# Patient Record
Sex: Male | Born: 1955 | Race: White | Hispanic: No | Marital: Married | Smoking: Current every day smoker
Health system: Southern US, Community
[De-identification: ages and names within clinical notes are randomized; demographics above are authoritative.]

## PROBLEM LIST (undated history)

## (undated) DIAGNOSIS — C61 Malignant neoplasm of prostate: Secondary | ICD-10-CM

## (undated) DIAGNOSIS — K219 Gastro-esophageal reflux disease without esophagitis: Secondary | ICD-10-CM

## (undated) DIAGNOSIS — M1990 Unspecified osteoarthritis, unspecified site: Secondary | ICD-10-CM

## (undated) DIAGNOSIS — J439 Emphysema, unspecified: Secondary | ICD-10-CM

## (undated) DIAGNOSIS — E7800 Pure hypercholesterolemia, unspecified: Secondary | ICD-10-CM

## (undated) DIAGNOSIS — Z860100 Personal history of colon polyps, unspecified: Secondary | ICD-10-CM

## (undated) DIAGNOSIS — F419 Anxiety disorder, unspecified: Secondary | ICD-10-CM

## (undated) DIAGNOSIS — J449 Chronic obstructive pulmonary disease, unspecified: Secondary | ICD-10-CM

## (undated) DIAGNOSIS — R0789 Other chest pain: Secondary | ICD-10-CM

## (undated) DIAGNOSIS — T7840XA Allergy, unspecified, initial encounter: Secondary | ICD-10-CM

## (undated) HISTORY — DX: Pure hypercholesterolemia, unspecified: E78.00

## (undated) HISTORY — DX: Other chest pain: R07.89

## (undated) HISTORY — DX: Personal history of colonic polyps: Z86.010

## (undated) HISTORY — DX: Unspecified osteoarthritis, unspecified site: M19.90

## (undated) HISTORY — DX: Chronic obstructive pulmonary disease, unspecified: J44.9

## (undated) HISTORY — DX: Emphysema, unspecified: J43.9

## (undated) HISTORY — DX: Anxiety disorder, unspecified: F41.9

## (undated) HISTORY — DX: Malignant neoplasm of prostate: C61

## (undated) HISTORY — DX: Gastro-esophageal reflux disease without esophagitis: K21.9

## (undated) HISTORY — DX: Allergy, unspecified, initial encounter: T78.40XA

## (undated) HISTORY — DX: Personal history of colon polyps, unspecified: Z86.0100

## (undated) HISTORY — PX: OTHER SURGICAL HISTORY: SHX169

---

## 2000-02-01 ENCOUNTER — Other Ambulatory Visit: Admission: RE | Admit: 2000-02-01 | Discharge: 2000-02-01 | Payer: Self-pay | Admitting: Gastroenterology

## 2000-02-01 ENCOUNTER — Encounter (INDEPENDENT_AMBULATORY_CARE_PROVIDER_SITE_OTHER): Payer: Self-pay | Admitting: Specialist

## 2002-07-29 HISTORY — PX: CHOLECYSTECTOMY: SHX55

## 2002-11-03 ENCOUNTER — Ambulatory Visit (HOSPITAL_COMMUNITY): Admission: RE | Admit: 2002-11-03 | Discharge: 2002-11-03 | Payer: Self-pay | Admitting: Pulmonary Disease

## 2002-11-03 ENCOUNTER — Encounter: Payer: Self-pay | Admitting: Pulmonary Disease

## 2005-01-25 ENCOUNTER — Ambulatory Visit: Payer: Self-pay | Admitting: Pulmonary Disease

## 2006-07-04 ENCOUNTER — Ambulatory Visit: Payer: Self-pay | Admitting: Pulmonary Disease

## 2006-09-03 ENCOUNTER — Ambulatory Visit: Payer: Self-pay | Admitting: Cardiovascular Disease

## 2007-06-26 ENCOUNTER — Telehealth: Payer: Self-pay | Admitting: Pulmonary Disease

## 2008-05-02 ENCOUNTER — Telehealth (INDEPENDENT_AMBULATORY_CARE_PROVIDER_SITE_OTHER): Payer: Self-pay | Admitting: *Deleted

## 2008-06-30 DIAGNOSIS — K219 Gastro-esophageal reflux disease without esophagitis: Secondary | ICD-10-CM | POA: Insufficient documentation

## 2008-06-30 DIAGNOSIS — F411 Generalized anxiety disorder: Secondary | ICD-10-CM | POA: Insufficient documentation

## 2008-06-30 DIAGNOSIS — T7840XA Allergy, unspecified, initial encounter: Secondary | ICD-10-CM | POA: Insufficient documentation

## 2008-06-30 DIAGNOSIS — M1990 Unspecified osteoarthritis, unspecified site: Secondary | ICD-10-CM | POA: Insufficient documentation

## 2008-06-30 DIAGNOSIS — D126 Benign neoplasm of colon, unspecified: Secondary | ICD-10-CM | POA: Insufficient documentation

## 2008-07-01 ENCOUNTER — Encounter: Payer: Self-pay | Admitting: Gastroenterology

## 2008-07-01 ENCOUNTER — Ambulatory Visit: Payer: Self-pay | Admitting: Pulmonary Disease

## 2008-07-01 DIAGNOSIS — R0789 Other chest pain: Secondary | ICD-10-CM | POA: Insufficient documentation

## 2008-07-01 DIAGNOSIS — E7800 Pure hypercholesterolemia, unspecified: Secondary | ICD-10-CM | POA: Insufficient documentation

## 2008-07-01 DIAGNOSIS — J439 Emphysema, unspecified: Secondary | ICD-10-CM | POA: Insufficient documentation

## 2008-07-03 LAB — CONVERTED CEMR LAB
Albumin: 4.2 g/dL (ref 3.5–5.2)
Alkaline Phosphatase: 73 units/L (ref 39–117)
BUN: 14 mg/dL (ref 6–23)
Cholesterol: 172 mg/dL (ref 0–200)
Creatinine, Ser: 0.9 mg/dL (ref 0.4–1.5)
Eosinophils Absolute: 0.2 10*3/uL (ref 0.0–0.7)
Eosinophils Relative: 2 % (ref 0.0–5.0)
GFR calc Af Amer: 114 mL/min
GFR calc non Af Amer: 95 mL/min
HCT: 45 % (ref 39.0–52.0)
HDL: 35.5 mg/dL — ABNORMAL LOW (ref >39.0)
MCV: 92.4 fL (ref 78.0–100.0)
Monocytes Absolute: 0.6 10*3/uL (ref 0.1–1.0)
PSA: 5.98 ng/mL — ABNORMAL HIGH (ref 0.10–4.00)
Platelets: 276 10*3/uL (ref 150–400)
Potassium: 4.6 meq/L (ref 3.5–5.1)
TSH: 1.04 microintl units/mL (ref 0.35–5.50)
Triglycerides: 81 mg/dL (ref 0–149)
WBC: 8.8 10*3/uL (ref 4.5–10.5)

## 2008-07-08 ENCOUNTER — Telehealth: Payer: Self-pay | Admitting: Pulmonary Disease

## 2008-08-12 ENCOUNTER — Ambulatory Visit: Payer: Self-pay | Admitting: Gastroenterology

## 2008-08-12 ENCOUNTER — Encounter: Payer: Self-pay | Admitting: Gastroenterology

## 2008-08-12 ENCOUNTER — Ambulatory Visit: Payer: Self-pay | Admitting: Pulmonary Disease

## 2008-08-14 LAB — CONVERTED CEMR LAB: PSA: 5.67 ng/mL — ABNORMAL HIGH (ref 0.10–4.00)

## 2008-08-16 ENCOUNTER — Encounter: Payer: Self-pay | Admitting: Gastroenterology

## 2008-08-18 ENCOUNTER — Encounter: Payer: Self-pay | Admitting: Pulmonary Disease

## 2008-09-02 ENCOUNTER — Encounter: Payer: Self-pay | Admitting: Pulmonary Disease

## 2008-09-09 ENCOUNTER — Encounter: Payer: Self-pay | Admitting: Pulmonary Disease

## 2008-09-15 ENCOUNTER — Encounter: Payer: Self-pay | Admitting: Pulmonary Disease

## 2008-09-27 ENCOUNTER — Encounter: Payer: Self-pay | Admitting: Pulmonary Disease

## 2008-10-31 ENCOUNTER — Telehealth: Payer: Self-pay | Admitting: Pulmonary Disease

## 2008-11-26 HISTORY — PX: PROSTATECTOMY: SHX69

## 2008-12-15 ENCOUNTER — Encounter: Payer: Self-pay | Admitting: Pulmonary Disease

## 2008-12-22 ENCOUNTER — Encounter: Payer: Self-pay | Admitting: Pulmonary Disease

## 2009-02-08 ENCOUNTER — Encounter: Payer: Self-pay | Admitting: Pulmonary Disease

## 2009-05-01 ENCOUNTER — Encounter: Payer: Self-pay | Admitting: Pulmonary Disease

## 2009-06-15 ENCOUNTER — Encounter: Payer: Self-pay | Admitting: Pulmonary Disease

## 2009-12-13 ENCOUNTER — Telehealth (INDEPENDENT_AMBULATORY_CARE_PROVIDER_SITE_OTHER): Payer: Self-pay | Admitting: *Deleted

## 2009-12-22 ENCOUNTER — Ambulatory Visit: Payer: Self-pay | Admitting: Pulmonary Disease

## 2009-12-22 DIAGNOSIS — C61 Malignant neoplasm of prostate: Secondary | ICD-10-CM | POA: Insufficient documentation

## 2009-12-22 LAB — CONVERTED CEMR LAB
Alkaline Phosphatase: 87 units/L (ref 39–117)
Basophils Absolute: 0 10*3/uL (ref 0.0–0.1)
Bilirubin, Direct: 0.1 mg/dL (ref 0.0–0.3)
Calcium: 9.4 mg/dL (ref 8.4–10.5)
Eosinophils Absolute: 0.1 10*3/uL (ref 0.0–0.7)
GFR calc non Af Amer: 112.37 mL/min (ref >60)
HCT: 44.1 % (ref 39.0–52.0)
Hemoglobin: 15.3 g/dL (ref 13.0–17.0)
Lymphs Abs: 2.2 10*3/uL (ref 0.7–4.0)
MCHC: 34.8 g/dL (ref 30.0–36.0)
Neutro Abs: 6.3 10*3/uL (ref 1.4–7.7)
RDW: 12.5 % (ref 11.5–14.6)
Sodium: 144 meq/L (ref 135–145)
TSH: 0.9 microintl units/mL (ref 0.35–5.50)
Total Bilirubin: 0.5 mg/dL (ref 0.3–1.2)
Total CHOL/HDL Ratio: 5
VLDL: 18.8 mg/dL (ref 0.0–40.0)

## 2010-07-19 ENCOUNTER — Encounter: Payer: Self-pay | Admitting: Pulmonary Disease

## 2010-08-28 NOTE — Progress Notes (Signed)
Summary: rx  Phone Note Call from Patient Call back at Work Phone [phone]   Caller: Patient Call For: nadel Reason for Call: Refill Medication, Talk to Nurse Summary of Call: Advair, Spiriva and Nexium need to be called in for [facility] supply [facility] - [phone]  Initial call taken by: Eugene Gavia,  Dec 13, 2009 2:24 PM  Follow-up for Phone Call        Rxs were refilled x 1 only because pt has not been seen in over 1 yr.  He has upcoming appt with SN on 12/22/09.  I advised that he keep appt for a years worth of refills.  Pt verbalized understanding. Follow-up by: Vernie Murders,  Dec 13, 2009 3:00 PM    Prescriptions: NEXIUM 40 MG CPDR (ESOMEPRAZOLE MAGNESIUM) Take 1 tablet by mouth once a day  #30 x 0   Entered by:   Vernie Murders   Authorized by:   Michele Mcalpine MD   Signed by:   Vernie Murders on 12/13/2009   Method used:   Historical   RxID:   [id number] SPIRIVA HANDIHALER 18 MCG CAPS (TIOTROPIUM BROMIDE MONOHYDRATE) inhale contents of 1 cap via handihaler daily...  #30 x 0   Entered by:   Vernie Murders   Authorized by:   Michele Mcalpine MD   Signed by:   Vernie Murders on 12/13/2009   Method used:   Historical   RxID:   [id number] ADVAIR DISKUS 250-50 MCG/DOSE MISC (FLUTICASONE-SALMETEROL) inhale one puff  two times a day   rinse mouth after each use  #1 x 0   Entered by:   Vernie Murders   Authorized by:   Michele Mcalpine MD   Signed by:   Vernie Murders on 12/13/2009   Method used:   Historical   RxID:   [id number]

## 2010-08-28 NOTE — Assessment & Plan Note (Signed)
Summary: cpx/jd   CC:  17 month ROV & CPX....  History of Present Illness: 55 y/o WM here for a follow up visit... he has multiple medical problems as noted below...  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   ~  Dec 22, 2009:  when last seen 12/09 his routine PSA was 5.98 & he was referred to [facility] in [facility] he lives for eval> +prostate cancer and s/p robotic prostatectomy 5/10 & doing well so far w/ PSA <0.01 on last check 11/10 w/ f/u due soon (Q63mo)... no new complaints or concerns>  he has bullous emphysema and normal A1AT level & phenotype (MM), unfortunately still smokes an occas pipe & we discussed this again today... he also had screening colonoscopy 1/10 by DrPatterson= divertics, tiny polyp w/o adenomatous change & f/u planned 57yrs... stable on current med regimen> refilled meds + TDAP vaccine today.    Current Problem List:   PHYSICAL EXAMINATION (ICD-V70.0) - he takes ASA 81mg /d along w/ MVI & Protegra...  ALLERGY (ICD-995.3) - uses OTC antihistamines Prn...  COPD (ICD-496) & EMPHYSEMATOUS BLEB (ICD-492.0) - on ADVAIR 250 Bid & SPIRIVA daily... unfortunately he still smokes a pipe on occas (w/ min cough, drainage, etc)... he has a neg FamHx of lung disease and a normal A1AT level (MM phenotype)... he is s/p VATS- RUL bullectomy in 1997 for infected bulla w/ A/F level.  ~  baseline CXR's back to 1990 showed large bilat bullae R>L & scarring at left base...  ~  f/u CXR's w/ vol loss on right from the surg, bullae on the left, scarring, etc...  ~  prev CT Chest revealed mult blebs & bullae in RUL & LUL to the lingular w/ scarring... 1997- 14cm RUL bulla w/ AF level (required VATS Bullectomy- path= emphysema, infected bulla w/ acute/ chr inflamm.  ~  CT Chest 10/09 in [place]= similar bullous dis on left w/ emphysema, post-op changes on right, NAD,etc.  ~  PFT's 6/00 showed FVC= 4.41 (84%), FEV1= 2.53 (59%), %1sec=  57, mid-flows= 25%pred...  ~  PFT's 6/06 showed FVC= 4.59 (87%), FEV1= 2.79 (65%), %1sec= 61, mid-flows= 27%pred...  ~  CXR 5/11 showed advanced chr lung dis w/ hyperinflation, scarring, decr vol on right> NAD.  Hx of CHEST PAIN, ATYPICAL (ICD-786.59) - on ASA 81mg /d... hx intermittent chest wall tightness/ spasm/ ?palpit/ numb in left arm- last 2-3 min and resolves spont, no exercise induced... s/p cardiac eval 2/08 by DrCooper- he wanted to do treadmill and holter but pt never did these and states he is doing fine w/o any incr in symptoms...  ~  EKG 5/11 showed NSR 74/min, IVCD, NAD (no ch from old tracings).Marland Kitchen  HYPERCHOLESTEROLEMIA, MILD (ICD-272.0) - on diet alone...  ~  FLP 12/07 showed TChol 182, TG 80, HDL 36, LDL 130... he prefers diet Rx.  ~  FLP 12/09 showed TChol 172, TG 81, HDL 36, LDL 120... rec- improve diet efforts, or consider statin.  ~  FLP 5/11 showed TChol 169, TG 94, HDL 37, LDL 113... continue diet efforts.  GERD (ICD-530.81) - on NEXIUM 40mg /d... he states he tried off the med but had to restart due to signif heartburn...  COLONIC POLYPS (ICD-211.3) & Family Hx of ADENOCARCINOMA, COLON (ICD-153.9)  - last colonoscopy 7/01 by DrPatterson showed diminutive rectal polyp= hyperplastic... f/u planned 60yrs but pt cancelled and never rescheduled the procedure... f/u colonoscopy 1/10 showed divertics & tiny polypw/o  adenomatous change (+FamHx of colon cancer in brother who died of the disease at age 78)...  ADENOCARCINOMA, PROSTATE (ICD-185) - ** SEE ABOVE **  DEGENERATIVE JOINT DISEASE (ICD-715.90) - hx DJD in knees and prev fractured arm...  ANXIETY (ICD-300.00)  Health Maintenance - He had PNEUMOVAX in 1999, and gets yearly flu shots... Pneumovax f/u vaccination given 12/09...    Allergies (verified): No Known Drug Allergies  Comments:  Nurse/Medical Assistant: The patient's medications and allergies were reviewed with the patient and were updated in the Medication and  Allergy Lists.  Past History:  Past Medical History: ALLERGY (ICD-995.3) COPD (ICD-496) EMPHYSEMATOUS BLEB (ICD-492.0) Hx of CHEST PAIN, ATYPICAL (ICD-786.59) HYPERCHOLESTEROLEMIA, MILD (ICD-272.0) GERD (ICD-530.81) COLONIC POLYPS (ICD-211.3) Family Hx of ADENOCARCINOMA, COLON (ICD-153.9) ADENOCARCINOMA, PROSTATE (ICD-185) DEGENERATIVE JOINT DISEASE (ICD-715.90) ANXIETY (ICD-300.00)  Past Surgical History: S/P VATS w/ infected bleb removed from RUL 1997 S/P cholecystectomy in 2004 S/P robotic prostatectomy 5/10 in [place]  Family History: Reviewed history from 07/01/2008 and no changes required. Father died age 26 w/ hemorrage Mother died age 60 w/ Alzheimer's disease 3 Siblings: 1 Brother died age 52 from colon cancer 2 Sisters alive and well  Social History: Reviewed history from 07/01/2008 and no changes required. 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  The patient denies fever, chills, sweats, anorexia, fatigue, weakness, malaise, weight loss, sleep disorder, blurring, diplopia, eye irritation, eye discharge, vision loss, eye pain, photophobia, earache, ear discharge, tinnitus, decreased hearing, nasal congestion, nosebleeds, sore throat, hoarseness, chest pain, palpitations, syncope, orthopnea, PND, peripheral edema, dyspnea at rest, excessive sputum, hemoptysis, wheezing, pleurisy, nausea, vomiting, diarrhea, constipation, change in bowel habits, abdominal pain, melena, hematochezia, jaundice, gas/bloating, indigestion/heartburn, dysphagia, odynophagia, dysuria, hematuria, urinary frequency, urinary hesitancy, nocturia, incontinence, back pain, joint pain, joint swelling, muscle cramps, muscle weakness, stiffness, arthritis, sciatica, restless legs, leg pain at night, leg pain with exertion, rash, itching, dryness, suspicious lesions,  paralysis, paresthesias, seizures, tremors, vertigo, transient blindness, frequent falls, frequent headaches, difficulty walking, depression, anxiety, memory loss, confusion, cold intolerance, heat intolerance, polydipsia, polyphagia, polyuria, unusual weight change, abnormal bruising, bleeding, enlarged lymph nodes, urticaria, allergic rash, hay fever, and recurrent infections.    Vital Signs:  Patient profile:   55 year old male Height:      72 inches Weight:      186.31 pounds BMI:     25.36 O2 Sat:      99 % on Room air Temp:     98.7 degrees F oral Pulse rate:   73 / minute BP sitting:   120 / 62  (right arm) Cuff size:   regular  Vitals Entered By: Randell Loop CMA (Dec 22, 2009 9:22 AM)  O2 Sat at Rest %:  99 O2 Flow:  Room air CC: 17 month ROV & CPX... Is Patient Diabetic? No Pain Assessment Patient in pain? no      Comments no changes in meds today   Physical Exam  Additional Exam:  WD, WN, 55 y/o WM in NAD... GENERAL:  Alert & oriented; pleasant & cooperative... HEENT:  [place]/AT, EOM-wnl, PERRLA, Fundi-benign, EACs-clear, TMs-wnl, NOSE-clear, THROAT-clear & wnl. NECK:  Supple w/ fairROM; no JVD; normal carotid impulses w/o bruits; no thyromegaly or nodules palpated; no lymphadenopathy. CHEST:  decr BS bilat, clear to P & A; without wheezes/ rales/ or rhonchi heard... HEART:  Regular Rhythm;  without murmurs/ rubs/ or gallops. ABDOMEN:  Soft & nontender; normal bowel sounds; no organomegaly or masses detected. EXT: without deformities or arthritic changes; no varicose veins/ venous insuffic/ or edema. NEURO:  CN's intact; motor testing normal; sensory testing normal; gait normal & balance OK. DERM:  No lesions noted; no rash etc...    CXR  Procedure date:  12/22/2009  Findings:      CHEST - 2 VIEW Comparison: 07/01/2008.   Findings: There is advanced chronic lung disease.  There is scarring in the right lung with decreased lung volume.  The left lung is  hyperinflated with scarring.  The heart shifted to the right.  Heart is relatively small.  There is no heart failure or pneumonia.  No mass lesion is present.   IMPRESSION: Advanced chronic lung disease.  No change from the  prior study.   Read By:  Camelia Phenes,  M.D.    EKG  Procedure date:  12/22/2009  Findings:      Normal sinus rhythm with rate of:  74/ min... Tracing shows IVCD, no acute changes...  SN   MISC. Report  Procedure date:  12/22/2009  Findings:      BMP (METABOL)   Sodium                    144 mEq/L                   135-145   Potassium                 4.7 mEq/L                   3.5-5.1   Chloride                  106 mEq/L                   96-112   Carbon Dioxide            30 mEq/L                    19-32   Glucose                   82 mg/dL                    07-37   BUN                       16 mg/dL                    1-06   Creatinine                0.8 mg/dL                   2.6-9.4   Calcium                   9.4 mg/dL                   8.5-46.2   GFR                       112.37 mL/min               >60  Hepatic/Liver Function Panel (HEPATIC)   Total Bilirubin  0.5 mg/dL                   1.6-1.0   Direct Bilirubin          0.1 mg/dL                   9.6-0.4   Alkaline Phosphatase      87 U/L                      39-117   AST                       23 U/L                      0-37   ALT                       27 U/L                      0-53   Total Protein             7.0 g/dL                    5.4-0.9   Albumin                   4.3 g/dL                    8.1-1.9  CBC Platelet w/Diff (CBCD)   White Cell Count          9.3 K/uL                    4.5-10.5   Red Cell Count            4.89 Mil/uL                 4.22-5.81   Hemoglobin                15.3 g/dL                   14.7-82.9   Hematocrit                44.1 %                      39.0-52.0   MCV                       90.1 fl                     78.0-100.0    Platelet Count            254.0 K/uL                  150.0-400.0   Neutrophil %              68.6 %                      43.0-77.0   Lymphocyte %              23.6 %                      12.0-46.0   Monocyte %  6.4 %                       3.0-12.0   Eosinophils%              1.2 %                       0.0-5.0   Basophils %               0.2 %                       0.0-3.0  Comments:      Lipid Panel (LIPID)   Cholesterol               169 mg/dL                   4-540   Triglycerides             94.0 mg/dL                  9.8-119.1   HDL                  [L]  47.82 mg/dL                 >95.62   LDL Cholesterol      [H]  130 mg/dL                   8-65             TSH (TSH)   FastTSH                   0.90 uIU/mL                 0.35-5.50   Impression & Recommendations:  Problem # 1:  PHYSICAL EXAMINATION (ICD-V70.0)  Orders: EKG w/ Interpretation (93000) T-2 View CXR (71020TC) TLB-BMP (Basic Metabolic Panel-BMET) (80048-METABOL) TLB-Hepatic/Liver Function Pnl (80076-HEPATIC) TLB-CBC Platelet - w/Differential (85025-CBCD) TLB-Lipid Panel (80061-LIPID) TLB-TSH (Thyroid Stimulating Hormone) (84443-TSH)  Problem # 2:  ADENOCARCINOMA, PROSTATE (ICD-185) Doing well s/p robotic prostatectomy 5/10 in [place]... continue Q32mo f/u w/ Urology in [facility]...  Problem # 3:  COPD (ICD-496) He has mod severe COPD, bullous emphysema, w/ prev bullectomy for infected RUL bleb in 1997... Continue current med Rx, CXR w/o change, must quit the pipe smoking & we discussed smoking cessation strategies... His updated medication list for this problem includes:    Advair Diskus 250-50 Mcg/dose Misc (Fluticasone-salmeterol) ..... Inhale one puff  two times a day   rinse mouth after each use    Spiriva Handihaler 18 Mcg Caps (Tiotropium bromide monohydrate) ..... Inhale contents of 1 cap via handihaler daily...  Orders: Prescription Created Electronically [phone])  Problem # 4:  Hx of CHEST  PAIN, ATYPICAL (ICD-786.59) No further CP... I have asked him to incr his exercise program...  Problem # 5:  HYPERCHOLESTEROLEMIA, MILD (ICD-272.0) FLP is improved on diet & closer to goal... continue diet efforts.  Problem # 6:  COLONIC POLYPS (ICD-211.3) He had colon 1/10 & f/u planned 15yrs...  Problem # 7:  OTHER MEDICAL PROBLEMS AS NOTED>>> OK TDAP today...  Complete Medication List: 1)  Bayer Aspirin 325 Mg Tabs (Aspirin) .... Take 1 tablet by mouth once a day 2)  Advair Diskus 250-50 Mcg/dose Misc (Fluticasone-salmeterol) .... Inhale one puff  two times a day  rinse mouth after each use 3)  Spiriva Handihaler 18 Mcg Caps (Tiotropium bromide monohydrate) .... Inhale contents of 1 cap via handihaler daily.Marland KitchenMarland Kitchen 4)  Nexium 40 Mg Cpdr (Esomeprazole magnesium) .... Take 1 tablet by mouth once a day 5)  Multivitamins Tabs (Multiple vitamin) .... Take 1 tablet by mouth once a day 6)  Protegra Cardio Tabs (Multiple vitamins-minerals) .... Take 1 tablet by mouth once a day  Other Orders: Tdap => 35yrs IM (16109) Admin 1st Vaccine (60454)  Patient Instructions: 1)  Today we updated your med list- see below.... 2)  We refilled your meds as requested... 3)  Today we did your follow up CXR, EKG, & FASTING blood work... please call the "phone tree" in a few days for your lab results.Marland KitchenMarland Kitchen 4)  We also gave you the combination Tetanus vaccine called the TDAP- it should be good for 44yrs.Marland KitchenMarland Kitchen 5)  You know you need to quit that pipe, right (just a friendly reminder)... 6)  Call for any problems.Marland KitchenMarland Kitchen 7)  Please schedule a follow-up appointment in 1 year. Prescriptions: NEXIUM 40 MG CPDR (ESOMEPRAZOLE MAGNESIUM) Take 1 tablet by mouth once a day  #90 x 4   Entered and Authorized by:   Michele Mcalpine MD   Signed by:   Michele Mcalpine MD on 12/22/2009   Method used:   Print then Give to Patient   RxID:   [id number] SPIRIVA HANDIHALER 18 MCG CAPS (TIOTROPIUM BROMIDE MONOHYDRATE) inhale contents of 1  cap via handihaler daily...  #90 x 4   Entered and Authorized by:   Michele Mcalpine MD   Signed by:   Michele Mcalpine MD on 12/22/2009   Method used:   Print then Give to Patient   RxID:   [id number] ADVAIR DISKUS 250-50 MCG/DOSE MISC (FLUTICASONE-SALMETEROL) inhale one puff  two times a day   rinse mouth after each use  #3 x 4   Entered and Authorized by:   Michele Mcalpine MD   Signed by:   Michele Mcalpine MD on 12/22/2009   Method used:   Print then Give to Patient   RxID:   [id number]    CardioPerfect ECG  ID: [id number] Patient: Anitra Lauth DOB: 10-Sep-1955 Age: 55 Years Old Sex: Male Race: White Physician: Jhonnie Aliano Technician: Randell Loop CMA Height: 72 Weight: 186.31 Status: Unconfirmed Past Medical History:   ALLERGY (ICD-995.3) COPD (ICD-496) EMPHYSEMATOUS BLEB (ICD-492.0) Hx of CHEST PAIN, ATYPICAL (ICD-786.59) HYPERCHOLESTEROLEMIA, MILD (ICD-272.0) GERD (ICD-530.81) COLONIC POLYPS (ICD-211.3) Family Hx of ADENOCARCINOMA, COLON (ICD-153.9) DEGENERATIVE JOINT DISEASE (ICD-715.90) ANXIETY (ICD-300.00)   Recorded: 12/22/2009 09:31 AM / 0 ms - Heart rate (maximum exercise)  Heartrate: 0 bpm  Interpretation:  Normal sinus rhythm with rate of:  74/ min... Tracing shows IVCD, no acute changes...  SN    Immunizations Administered:  Tetanus Vaccine:    Vaccine Type: Tdap    Site: right deltoid    Mfr: boostrix    Dose: 0.5 ml    Route: IM    Given by: Randell Loop CMA    Exp. Date: 10/21/2011    Lot #: [id number]    VIS given: 06/16/07 version given Dec 22, 2009.

## 2010-08-30 NOTE — Letter (Signed)
Summary: [facility]   Imported By: Sherian Rein 08/10/2010 13:44:50  _____________________________________________________________________  External Attachment:    Type:   Image     Comment:   External Document

## 2010-12-14 ENCOUNTER — Encounter: Payer: Self-pay | Admitting: Pulmonary Disease

## 2010-12-14 NOTE — Letter (Signed)
September 03, 2006    Fred [name]. Kriste Basque, MD  [address]  [place],  [place] [address]   RE:  Fred [name], Fred [name]  MRN:  [id number]  /  DOB:  [birth year]   Dear Dr. Kriste Basque:   Thanks for the opportunity to see Fred [name] in consultation today  as an outpatient at the [facility].   As you know, Fred [name] is a 55 year old gentleman who presents for  evaluation of chest pain. He describes a gurgling feeling in his chest  that has occurred intermittently over the past 3 to 4 months. He has  approximately 1 to 2 episodes per week, but this is variable. He really  does not describe pain in the chest, but rather a fluttering feeling. He  also has tingling in his arms that occurs in conjunction with this. The  episodes that he describes last for 1 minute or less. They have always  been non-exertional. They have occurred during driving as well as when  resting in the evenings. He has not been engaged in heavy exertion, but  has not had symptoms with physical activity. He has some associated  dyspnea where he feels like he has a little trouble catching his breath.  This is also fairly transient symptom. He states that after one of these  episodes he feels weak for approximately 15 minutes and then feels back  to his normal state of health. He denies any chest pressure or pain. He  has not had syncope, orthopnea, PND, edema or claudication symptoms.   Regarding his cardiac risk factors, he has no history of hypertension,  diabetes, or dyslipidemia. He does have a history of premature coronary  artery disease in his brother when he was in his early 29s.   Current medications include Advair 250/50 mcg, aspirin 325 mg daily,  multivitamin daily, Protegra daily, Nexium 40 mg daily, and Spiriva  daily.   Allergies- No known drug allergies.   Past medical history:  1. Bullus lung disease, status post right upper lobectomy. This is      thought to possibly be from toxin exposure.  2. Gastroesophageal reflux disease.   Social history:  The patient is married. He has two children. He does not exercise  regularly. He smokes a pipe. He drinks alcohol only rarely. He drinks 3  cups of coffee daily.   Family history:  The patient's mother died at age 37 of Alzheimer's disease. His father  died at age 19 of a massive hemorrhage. He has two sisters who are alive  and well in their 66s and a brother who died at age 38 of cancer. His  brother had premature coronary artery disease as described above.   Review of systems:  A complete 12-point review of systems was performed. Pertinent positives  included seasonal allergies, fatigue, reflux, and emphysema.   On physical examination, the patient is alert and oriented. He is in no  acute distress. He is a healthy-appearing white male. Weight is 192  pounds. Blood pressure is 121/77. Heart rate is 76. Respiratory rate is  12.  HEENT: Is normal.  NECK: Normal carotid upstrokes without bruits. Jugular venous pressure  is normal. No thyromegaly or thyroid nodules.  LUNGS:  Clear to auscultation bilaterally.  CARDIOVASCULAR: Apex is discrete and nondisplaced. The heart is regular  rate and rhythm without murmurs or gallops.  ABDOMEN: Soft and nontender. No organomegaly. No abdominal bruits.  EXTREMITIES: No clubbing, cyanosis or edema. Peripheral pulses are 2+  and equal throughout. There are no femoral artery bruits.  SKIN: Is warm and dry without rash.  NEUROLOGIC: Cranial nerves II-XII are intact. Strength is  5/5 and equal  in the arms and legs bilaterally.  LYMPHATICS: There is no lymphadenopathy.   EKG: Demonstrates normal sinus rhythm with a nonspecific  intraventricular conduction delay.   Assessment:  Fred [name] is a 55 year old gentleman with atypical chest discomfort and  palpitations. It is somewhat difficult to delineate his symptoms, but he  seems most symptomatic from a feeling of palpitations. His symptoms  are  fleeting and occur at rest and I am suspicious that he is having  symptomatic PVCs. I do not think he has significant risk for coronary  artery disease, nor do I think that his symptoms are related to  obstructive coronary disease. I would like to evaluate him with an  exercise treadmill stress test to evaluate for any rhythm problems with  exercise as well as evaluate his overall exercise capacity. I have also  requested a 21-day CardioNet event recorder as I think this will be the  best way to capture his symptoms. He is not having frequent enough  symptoms to have a high yield with a 24 or 48 hour Holter monitor.   I advised him to reduce his caffeine intake and switch to decaf coffee  as well as push fluids. I do not think that any medication is warranted  at this point.   I will be in touch with Fred [name] after the results of his studies are  complete. Dr. Kriste Basque, thanks again for allowing me to see this very nice  gentleman in consultation. Please feel free to call at any time with  questions regarding his care.    Sincerely,      Fred Fells. Excell Seltzer, MD  Electronically Signed    MDC/MedQ  DD: 09/03/2006  DT: 09/03/2006  Job #: [id number]

## 2010-12-21 ENCOUNTER — Ambulatory Visit: Payer: Self-pay | Admitting: Pulmonary Disease

## 2011-01-09 ENCOUNTER — Telehealth: Payer: Self-pay | Admitting: Pulmonary Disease

## 2011-01-09 MED ORDER — PANTOPRAZOLE SODIUM 40 MG PO TBEC: 40.0000 mg | DELAYED_RELEASE_TABLET | Freq: Every day | ORAL | Status: DC

## 2011-01-09 NOTE — Telephone Encounter (Signed)
[id number] 

## 2011-01-09 NOTE — Telephone Encounter (Signed)
Nexium is not covered under the pt's ins plan. The covered alternatives are:  Omeprazole, Lansoprazole, Pantoprazole or Omeprazole w/ Bicarb. Would one of the alternatives be appropriate for this patient? Pls advise.

## 2011-01-09 NOTE — Telephone Encounter (Signed)
Spoke with pt and made aware of med change. He verbalized understanding and new rx was sent to [facility] [address] [facility].

## 2011-01-09 NOTE — Telephone Encounter (Signed)
Per SN---ok to change the nexium to the pantoprazole 40mg    1 daily--take 30 minutes prior to a meal  Either #30 or #90. thanks

## 2011-03-17 ENCOUNTER — Other Ambulatory Visit: Payer: Self-pay | Admitting: Pulmonary Disease

## 2011-03-26 ENCOUNTER — Other Ambulatory Visit: Payer: Self-pay | Admitting: Pulmonary Disease

## 2011-04-12 ENCOUNTER — Encounter: Payer: Self-pay | Admitting: Pulmonary Disease

## 2011-04-26 ENCOUNTER — Other Ambulatory Visit (INDEPENDENT_AMBULATORY_CARE_PROVIDER_SITE_OTHER): Payer: BC Managed Care – PPO

## 2011-04-26 ENCOUNTER — Ambulatory Visit (INDEPENDENT_AMBULATORY_CARE_PROVIDER_SITE_OTHER): Payer: BC Managed Care – PPO | Admitting: Pulmonary Disease

## 2011-04-26 ENCOUNTER — Encounter: Payer: Self-pay | Admitting: Pulmonary Disease

## 2011-04-26 ENCOUNTER — Ambulatory Visit (INDEPENDENT_AMBULATORY_CARE_PROVIDER_SITE_OTHER)
Admission: RE | Admit: 2011-04-26 | Discharge: 2011-04-26 | Disposition: A | Discharge status: Home / Self Care | Payer: BC Managed Care – PPO | Source: Ambulatory Visit | Attending: Pulmonary Disease | Admitting: Pulmonary Disease

## 2011-04-26 VITALS — BP 112/60 | HR 63 | Temp 97.2°F | Ht 72.0 in | Wt 181.6 lb

## 2011-04-26 DIAGNOSIS — J449 Chronic obstructive pulmonary disease, unspecified: Secondary | ICD-10-CM

## 2011-04-26 DIAGNOSIS — Z23 Encounter for immunization: Secondary | ICD-10-CM

## 2011-04-26 DIAGNOSIS — Z0000 Encounter for general adult medical examination without abnormal findings: Secondary | ICD-10-CM

## 2011-04-26 DIAGNOSIS — C61 Malignant neoplasm of prostate: Secondary | ICD-10-CM

## 2011-04-26 DIAGNOSIS — E7800 Pure hypercholesterolemia, unspecified: Secondary | ICD-10-CM

## 2011-04-26 DIAGNOSIS — M1990 Unspecified osteoarthritis, unspecified site: Secondary | ICD-10-CM

## 2011-04-26 DIAGNOSIS — D126 Benign neoplasm of colon, unspecified: Secondary | ICD-10-CM

## 2011-04-26 DIAGNOSIS — J439 Emphysema, unspecified: Secondary | ICD-10-CM

## 2011-04-26 DIAGNOSIS — K219 Gastro-esophageal reflux disease without esophagitis: Secondary | ICD-10-CM

## 2011-04-26 LAB — CBC WITH DIFFERENTIAL/PLATELET
Basophils Relative: 0.4 % (ref 0.0–3.0)
Eosinophils Absolute: 0.1 10*3/uL (ref 0.0–0.7)
Eosinophils Relative: 1.1 % (ref 0.0–5.0)
HCT: 44.5 % (ref 39.0–52.0)
Lymphs Abs: 2.5 10*3/uL (ref 0.7–4.0)
MCHC: 34 g/dL (ref 30.0–36.0)
MCV: 90.6 fl (ref 78.0–100.0)
Monocytes Absolute: 0.5 10*3/uL (ref 0.1–1.0)
Neutro Abs: 5.9 10*3/uL (ref 1.4–7.7)
RBC: 4.92 Mil/uL (ref 4.22–5.81)
WBC: 9 10*3/uL (ref 4.5–10.5)

## 2011-04-26 LAB — HEPATIC FUNCTION PANEL
Bilirubin, Direct: 0.2 mg/dL (ref 0.0–0.3)
Total Bilirubin: 1 mg/dL (ref 0.3–1.2)

## 2011-04-26 LAB — BASIC METABOLIC PANEL
BUN: 14 mg/dL (ref 6–23)
Chloride: 107 mEq/L (ref 96–112)
Creatinine, Ser: 0.7 mg/dL (ref 0.4–1.5)
GFR: 120.8 mL/min (ref >60.00)
Potassium: 4.4 mEq/L (ref 3.5–5.1)

## 2011-04-26 LAB — LIPID PANEL
Cholesterol: 172 mg/dL (ref 0–200)
LDL Cholesterol: 118 mg/dL — ABNORMAL HIGH (ref 0–99)
Total CHOL/HDL Ratio: 4
Triglycerides: 52 mg/dL (ref 0.0–149.0)
VLDL: 10.4 mg/dL (ref 0.0–40.0)

## 2011-04-26 MED ORDER — FLUTICASONE-SALMETEROL 250-50 MCG/DOSE IN AEPB: 1.0000 | INHALATION_SPRAY | Freq: Two times a day (BID) | RESPIRATORY_TRACT | Status: DC

## 2011-04-26 MED ORDER — ESOMEPRAZOLE MAGNESIUM 40 MG PO CPDR: 40.0000 mg | DELAYED_RELEASE_CAPSULE | Freq: Every day | ORAL | Status: DC

## 2011-04-26 MED ORDER — TIOTROPIUM BROMIDE MONOHYDRATE 18 MCG IN CAPS: 18.0000 ug | ORAL_CAPSULE | Freq: Every day | RESPIRATORY_TRACT | Status: DC

## 2011-04-26 NOTE — Patient Instructions (Signed)
Today we updated your med list in EPIC...    We refilled your ADVAIR & SPIRIVA...    We changed back to the [facility] for your reflux since the Protonix wasn't working...  Please quit the last of the pipe smoking!!!  Today we did your follow up CXR, EKG, & fasting blood work...    Please call the PHONE TREE in a few days for your results...    Dial [id number] & when prompted enter your patient number followed by the # symbol...    Your patient number is:  [id number]#  We discussed the need for a formal, more vigorous exercise program...  Call for any questions...  Let's plan another follow up visit in 37yr, sooner if needed for problems.Marland KitchenMarland Kitchen

## 2011-04-27 ENCOUNTER — Encounter: Payer: Self-pay | Admitting: Pulmonary Disease

## 2011-04-27 NOTE — Progress Notes (Signed)
Subjective:    Patient ID: Fred [name], male    DOB: 07-31-1955, 55 y.o.   MRN: [id number]  HPI 55 y/o WM here for a follow up visit... he has multiple medical problems as noted below...  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  ~  Dec 22, 2009:  when last seen 12/09 his routine PSA was 5.98 & he was referred to DrChapman in [facility] where he lives for eval> +prostate cancer and s/p robotic prostatectomy 5/10 & doing well so far w/ PSA <0.01 on last check 11/10 w/ f/u due soon (Q61mo)... no new complaints or concerns>  he has bullous emphysema and normal A1AT level & phenotype (MM), unfortunately still smokes an occas pipe & we discussed this again today... he also had screening colonoscopy 1/10 by DrPatterson= divertics, tiny polyp w/o adenomatous change & f/u planned 81yrs... stable on current med regimen> refilled meds + TDAP vaccine today.  ~  April 26, 2011:  Yearly ROV & CPX> Fred [name] continues to do well w/ f/u PSA at the zero number & he says that DrChapman asked that we check his PSA as well; he had an episode of micturition syncope that occurred last yr after arising one night to empty full bladder & he hit the deck w/ tooth injury & fx rib; he is c/o some reflux symptoms which he blames on the fact that his insurance co required switch from Nexium which worked to [employer] which is not working (we will switch back); he still smokes a pipe daily despite his bullous emphysema & the risk to his lungs, we discussed this again today, repeated CXR & PFT, & begged him to quit all smoking... SEE PROB LIST BELOW >>          Problem List:  ALLERGY (ICD-995.3) - uses OTC antihistamines Prn...  COPD (ICD-496) & EMPHYSEMATOUS BLEB (ICD-492.0) - on ADVAIR 250 Bid & SPIRIVA daily... unfortunately he still smokes a pipe daily (w/ min cough, drainage, etc)... he has a neg FamHx of lung disease and a normal A1AT level (MM phenotype)... 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 ~  baseline CXR's back to 1990 showed large bilat bullae R>L & scarring at left base... ~  f/u CXR's w/ vol loss on right from the surg, bullae on the left, scarring, etc... ~  prev CT Chest revealed mult blebs & bullae in RUL & LUL to the lingular w/ scarring... 1997= 14cm RUL bulla w/ AF level (required VATS Bullectomy- path= emphysema, infected bulla w/ acute/ chr inflamm). ~  CT Chest 10/09 in [place]= similar bullous dis on left w/ emphysema, post-op changes on right, NAD,etc. ~  PFT 6/00 showed FVC= 4.41 (84%), FEV1= 2.53 (59%), %1sec= 57, mid-flows= 25%pred... ~  PFT 6/06 showed FVC= 4.59 (87%), FEV1= 2.79 (65%), %1sec= 61, mid-flows= 27%pred... ~  CXR 5/11 showed advanced chr lung dis w/ hyperinflation, scarring, decr vol on right> NAD. ~  PFT 9/12 showed FVC= 4.07 (79%), FEV1= 2.34 (57%), %1sec= 57, mid-flows= 24%pred... MUST QUIT ALL SMOKING! ~  CXR 9/12 showed no signif change in his bullous lung dis & evid of prev surg on the right...  Hx of CHEST PAIN, ATYPICAL (ICD-786.59) - on ASA 81mg /d... hx intermittent chest wall tightness/ spasm/ ?palpit/ numb in left arm- last 2-3 min and resolves spont, not exercise induced... s/p cardiac eval 2/08 by DrCooper- he wanted to  do treadmill and holter but pt never did these and states he is doing fine w/o any incr in symptoms... ~  EKG 5/11 showed NSR 74/min, IVCD, NAD (no ch from old tracings) ~  EKG 9/12 showed NSR 68/min, IVCD, NAD & no change...  HYPERCHOLESTEROLEMIA, MILD (ICD-272.0) - on diet alone... ~  FLP 12/07 showed TChol 182, TG 80, HDL 36, LDL 130... he prefers diet Rx. ~  FLP 12/09 showed TChol 172, TG 81, HDL 36, LDL 120... rec- improve diet efforts, or consider statin. ~  FLP 5/11 showed TChol 169, TG 94, HDL 37, LDL 113... continue diet efforts. ~  FLP 9/12 showed TChol 172, TG 52, HDL 43, LDL 118  GERD (ICD-530.81) - on PROTONIX 40mg /d... he states he tried off  PPI meds but had to restart due to signif heartburn... ~  9/12:  He reports that insurance co required switch off Nexium onto Protonix but the latter isn't working w/ return of severe reflux symptoms & he wants to restart the NEXIUM 40mg /d...  COLONIC POLYPS (ICD-211.3) & Family Hx of ADENOCARCINOMA, COLON (ICD-153.9)  - colonoscopy 7/01 by DrPatterson showed diminutive rectal polyp= hyperplastic... f/u planned 36yrs but pt cancelled and never rescheduled the procedure... f/u colonoscopy done 1/10 showed divertics & tiny polyp w/o adenomatous change (+FamHx of colon cancer in brother who died of the disease at age 45)... Repeat due 1/15.  ADENOCARCINOMA, PROSTATE (ICD-185) >> ~  5/11:  when seen 12/09 his routine PSA was 5.98 & he was referred to DrChapman in [facility] where he lives for eval> +prostate cancer and s/p robotic prostatectomy 5/10 & doing well so far w/ PSA <0.01 on last check 11/10 w/ f/u due soon (Q65mo). ~  He had episode of micturition syncope that occurred one night- awoke w/ full bladder & was standing to urinate, then fell w/ transient syncope, injured tooth & fx rib; recovered uneventfully & he is asked to sit down to empty bladder, stand slowly & be careful> no recurrence. ~  9/12:  He tells me that DrChapman "released me" & he wants [name] to do yearly PSA f/u here; PSA today = zero...  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  ANXIETY (ICD-300.00)  Health Maintenance >> ~  GI:  His brother died age 8 w/ colon cancer; pt had hyperplastic polyp in past; last colonoscopy was 1/10 by DrPatterson- neg, f/u planned 60yrs. ~  GU:  Hx prostate cancer Dx 2010 w/ Bx DrChapman in [place]; s/p robotic surg 5/10 & doing well since then; PSA= zero ~  Immuniz:  He had PNEUMOVAX in 1999, and gets yearly flu shots... Pneumovax f/u vaccination given 12/09... Given TDAP 5/11   Past Surgical History  Procedure Date  . Other surgical history      S/P VATS w/ infected bleb removed from RUL 1997  . Cholecystectomy 2004  . Prostatectomy 11/2008    S/P robotic prostatectomy in [facility]    Outpatient Encounter Prescriptions as of 04/26/2011  Medication Sig Dispense Refill  . aspirin 325 MG tablet Take 325 mg by mouth daily.        . Fluticasone-Salmeterol (ADVAIR DISKUS) 250-50 MCG/DOSE AEPB Inhale 1 puff into the lungs 2 (two) times daily.  3 each  3  . Multiple Vitamin (MULTIVITAMIN) tablet Take 1 tablet by mouth daily.        . Multiple Vitamins-Minerals (PROTEGRA CARDIO PO) Take 1 tablet by mouth daily.        Marland Kitchen  tiotropium (SPIRIVA) 18 MCG inhalation capsule Place 1 capsule (18 mcg total) into inhaler and inhale daily.  90 capsule  3  . DISCONTD: pantoprazole (PROTONIX) 40 MG tablet Take 1 tablet (40 mg total) by mouth daily before breakfast.  90 tablet  3  . esomeprazole (NEXIUM) 40 MG capsule Take 1 capsule (40 mg total) by mouth daily before breakfast.  90 capsule  3    No Known Allergies   Current Medications, Allergies, Past Medical History, Past Surgical History, Family History, and Social History were reviewed in [employer] record.     Review of Systems        The patient complains of dyspnea on exertion and cough.  The patient denies fever, chills, sweats, anorexia, fatigue, weakness, malaise, weight loss, sleep disorder, blurring, diplopia, eye irritation, eye discharge, vision loss, eye pain, photophobia, earache, ear discharge, tinnitus, decreased hearing, nasal congestion, nosebleeds, sore throat, hoarseness, chest pain, palpitations, syncope, orthopnea, PND, peripheral edema, dyspnea at rest, excessive sputum, hemoptysis, wheezing, pleurisy, nausea, vomiting, diarrhea, constipation, change in bowel habits, abdominal pain, melena, hematochezia, jaundice, gas/bloating, indigestion/heartburn, dysphagia, odynophagia, dysuria, hematuria, urinary frequency, urinary hesitancy, nocturia, incontinence, back pain,  joint pain, joint swelling, muscle cramps, muscle weakness, stiffness, arthritis, sciatica, restless legs, leg pain at night, leg pain with exertion, rash, itching, dryness, suspicious lesions, paralysis, paresthesias, seizures, tremors, vertigo, transient blindness, frequent falls, frequent headaches, difficulty walking, depression, anxiety, memory loss, confusion, cold intolerance, heat intolerance, polydipsia, polyphagia, polyuria, unusual weight change, abnormal bruising, bleeding, enlarged lymph nodes, urticaria, allergic rash, hay fever, and recurrent infections.     Objective:   Physical Exam    WD, WN, 55 y/o WM in NAD... GENERAL:  Alert & oriented; pleasant & cooperative... HEENT:  [place]/AT, EOM-wnl, PERRLA, Fundi-benign, EACs-clear, TMs-wnl, NOSE-clear, THROAT-clear & wnl. NECK:  Supple w/ fairROM; no JVD; normal carotid impulses w/o bruits; no thyromegaly or nodules palpated; no lymphadenopathy. CHEST:  decr BS bilat, clear to P & A; without wheezes/ rales/ or rhonchi heard... HEART:  Regular Rhythm; without murmurs/ rubs/ or gallops. ABDOMEN:  Soft & nontender; normal bowel sounds; no organomegaly or masses detected. EXT: without deformities or arthritic changes; no varicose veins/ venous insuffic/ or edema. NEURO:  CN's intact; motor testing normal; sensory testing normal; gait normal & balance OK. DERM:  No lesions noted; no rash etc...   Assessment & Plan:   CPX>  He is 55 y/o and has had a number of serious health issues; MUST QUIT ALL SMOKING...  COPD/ Bullous Emphysema>  He is s/p RUL bullectomy1997 for an infected bulla w/ A/F level; A1AT prev measured and wnl/ MM phenotype; PFT w/ mod airflow obstruction; must quit all smoking... Continue ADVAIR & SPIRIVA, Mucinex as needed, etc...  Hx AtypCP>  No further chest discomfort; EKG w/ NSR, IVCD, NAD...  Borderline CHOL>  LDL not at goal but he is not inclined to take statin Rx; therefore diet alone...  GERD>  He has signif  symptoms and NOT improved w/ Protonix; NEXIUM is the only PPI that works for him & we will re-write this med...  Colon Polyps>  As noted brother died age 33 w/ colon cancer; last colonoscopy 1/10 was ok & f/u planned 75yrs...  PROSTATE CANCER> DrChapman in [place] has released him & asked that we do yearly PSA; todays lab w/ PSA= zero... NOTE episode of micturition syncope w/o recurrence...  DJD>  Stable, uses OTC analgesics as needed...  Anxiety>  Aware, stable, no meds required.Marland KitchenMarland Kitchen

## 2011-05-31 ENCOUNTER — Telehealth: Payer: Self-pay | Admitting: Pulmonary Disease

## 2011-05-31 NOTE — Telephone Encounter (Signed)
Called, spoke with pt.  I informed him TP recs he use mucinex DM bid prn for the cough/congestion, zyrtec 10mg  qhs prn drainage, saline nasal rinses prn.  If his symptoms do not improve or get worse to call office back or seek emergency care.  He verbalized understanding of these recs.

## 2011-05-31 NOTE — Telephone Encounter (Signed)
Mucinex DM Twice daily  As needed  Cough/congestion  Zyrtec 10mg  At bedtime  As needed  Drainage.  Saline nasal rinses As needed   Please contact office for sooner follow up if symptoms do not improve or worsen or seek emergency care

## 2011-05-31 NOTE — Telephone Encounter (Signed)
Spoke with pt. He is c/o prod cough with clear sputum and sore throat x 2 days. He states that he has had no fever, sob or wheeze. Did note some chills last night. Would like something called in. Please advise, thanks! No Known Allergies

## 2012-03-03 ENCOUNTER — Telehealth: Payer: Self-pay | Admitting: Pulmonary Disease

## 2012-03-03 NOTE — Telephone Encounter (Signed)
Called pt to schedule follow up apt x3.Sent letter 02/24/12. ° °

## 2012-04-21 ENCOUNTER — Other Ambulatory Visit: Payer: Self-pay | Admitting: Pulmonary Disease

## 2012-04-30 ENCOUNTER — Encounter: Payer: Self-pay | Admitting: *Deleted

## 2012-05-01 ENCOUNTER — Ambulatory Visit (INDEPENDENT_AMBULATORY_CARE_PROVIDER_SITE_OTHER)
Admission: RE | Admit: 2012-05-01 | Discharge: 2012-05-01 | Disposition: A | Discharge status: Home / Self Care | Payer: BC Managed Care – PPO | Source: Ambulatory Visit | Attending: Pulmonary Disease | Admitting: Pulmonary Disease

## 2012-05-01 ENCOUNTER — Encounter: Payer: Self-pay | Admitting: Pulmonary Disease

## 2012-05-01 ENCOUNTER — Ambulatory Visit (INDEPENDENT_AMBULATORY_CARE_PROVIDER_SITE_OTHER): Payer: BC Managed Care – PPO | Admitting: Pulmonary Disease

## 2012-05-01 ENCOUNTER — Other Ambulatory Visit (INDEPENDENT_AMBULATORY_CARE_PROVIDER_SITE_OTHER): Payer: BC Managed Care – PPO

## 2012-05-01 VITALS — BP 102/60 | HR 66 | Temp 97.2°F | Ht 72.0 in | Wt 183.2 lb

## 2012-05-01 DIAGNOSIS — K219 Gastro-esophageal reflux disease without esophagitis: Secondary | ICD-10-CM

## 2012-05-01 DIAGNOSIS — C61 Malignant neoplasm of prostate: Secondary | ICD-10-CM

## 2012-05-01 DIAGNOSIS — E7800 Pure hypercholesterolemia, unspecified: Secondary | ICD-10-CM

## 2012-05-01 DIAGNOSIS — Z0000 Encounter for general adult medical examination without abnormal findings: Secondary | ICD-10-CM

## 2012-05-01 DIAGNOSIS — Z23 Encounter for immunization: Secondary | ICD-10-CM

## 2012-05-01 DIAGNOSIS — J439 Emphysema, unspecified: Secondary | ICD-10-CM

## 2012-05-01 DIAGNOSIS — D126 Benign neoplasm of colon, unspecified: Secondary | ICD-10-CM

## 2012-05-01 DIAGNOSIS — J449 Chronic obstructive pulmonary disease, unspecified: Secondary | ICD-10-CM

## 2012-05-01 LAB — PSA: PSA: 0 ng/mL — ABNORMAL LOW (ref 0.10–4.00)

## 2012-05-01 LAB — CBC WITH DIFFERENTIAL/PLATELET
Basophils Relative: 0.2 % (ref 0.0–3.0)
Eosinophils Relative: 1.1 % (ref 0.0–5.0)
HCT: 47 % (ref 39.0–52.0)
Lymphs Abs: 2.7 10*3/uL (ref 0.7–4.0)
MCV: 91.8 fl (ref 78.0–100.0)
Monocytes Absolute: 0.6 10*3/uL (ref 0.1–1.0)
Monocytes Relative: 6 % (ref 3.0–12.0)
Neutrophils Relative %: 67.8 % (ref 43.0–77.0)
Platelets: 272 10*3/uL (ref 150.0–400.0)
RBC: 5.12 Mil/uL (ref 4.22–5.81)
WBC: 10.6 10*3/uL — ABNORMAL HIGH (ref 4.5–10.5)

## 2012-05-01 LAB — HEPATIC FUNCTION PANEL
AST: 22 U/L (ref 0–37)
Albumin: 4.2 g/dL (ref 3.5–5.2)
Total Bilirubin: 0.8 mg/dL (ref 0.3–1.2)

## 2012-05-01 LAB — LIPID PANEL
Cholesterol: 198 mg/dL (ref 0–200)
HDL: 46.8 mg/dL (ref >39.00)
LDL Cholesterol: 131 mg/dL — ABNORMAL HIGH (ref 0–99)
Triglycerides: 99 mg/dL (ref 0.0–149.0)
VLDL: 19.8 mg/dL (ref 0.0–40.0)

## 2012-05-01 LAB — URINALYSIS
Hgb urine dipstick: NEGATIVE
Leukocytes, UA: NEGATIVE
Nitrite: NEGATIVE
Total Protein, Urine: NEGATIVE
Urobilinogen, UA: 0.2 (ref 0.0–1.0)

## 2012-05-01 LAB — BASIC METABOLIC PANEL
BUN: 14 mg/dL (ref 6–23)
Creatinine, Ser: 0.8 mg/dL (ref 0.4–1.5)
GFR: 111.38 mL/min (ref >60.00)
Glucose, Bld: 96 mg/dL (ref 70–99)

## 2012-05-01 LAB — TSH: TSH: 0.79 u[IU]/mL (ref 0.35–5.50)

## 2012-05-01 MED ORDER — TIOTROPIUM BROMIDE MONOHYDRATE 18 MCG IN CAPS: 18.0000 ug | ORAL_CAPSULE | Freq: Every day | RESPIRATORY_TRACT | Status: DC

## 2012-05-01 MED ORDER — FLUTICASONE-SALMETEROL 250-50 MCG/DOSE IN AEPB: 1.0000 | INHALATION_SPRAY | Freq: Two times a day (BID) | RESPIRATORY_TRACT | Status: DC

## 2012-05-01 MED ORDER — ESOMEPRAZOLE MAGNESIUM 40 MG PO CPDR: 40.0000 mg | DELAYED_RELEASE_CAPSULE | Freq: Every day | ORAL | Status: DC

## 2012-05-01 NOTE — Patient Instructions (Addendum)
Today we updated your med list in our EPIC system...    Continue your current medications the same...    We refilled your meds per request...  Today we did your follow up CXR & FASTING blood work...    We will call you w/ the results when avail...  We gave you the 2013 Flu vaccine today...  Call for any problems.Marland KitchenMarland Kitchen

## 2012-05-01 NOTE — Progress Notes (Signed)
Subjective:    Patient ID: Fred [name], male    DOB: November 11, 1955, 56 y.o.   MRN: [id number]  HPI 56 y/o WM here for a follow up visit... he has multiple medical problems as noted below...  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  ~  Dec 22, 2009:  when last seen 12/09 his routine PSA was 5.98 & he was referred to DrChapman in [facility] where he lives for eval> +prostate cancer and s/p robotic prostatectomy 5/10 & doing well so far w/ PSA <0.01 on last check 11/10 w/ f/u due soon (Q85mo)... no new complaints or concerns>  he has bullous emphysema and normal A1AT level & phenotype (MM), unfortunately still smokes an occas pipe & we discussed this again today... he also had screening colonoscopy 1/10 by DrPatterson= divertics, tiny polyp w/o adenomatous change & f/u planned 29yrs... stable on current med regimen> refilled meds + TDAP vaccine today.  ~  April 26, 2011:  Yearly ROV & CPX> Fred [name] continues to do well w/ f/u PSA at the zero number & he says that DrChapman asked that we check his PSA as well; he had an episode of micturition syncope that occurred last yr after arising one night to empty full bladder & he hit the deck w/ tooth injury & fx rib; he is c/o some reflux symptoms which he blames on the fact that his insurance co required switch from Nexium which worked to [employer] which is not working (we will switch back); he still smokes a pipe daily despite his bullous emphysema & the risk to his lungs, we discussed this again today, repeated CXR & PFT, & begged him to quit all smoking... SEE PROB LIST BELOW >>  ~  May 01, 2102:  Year ROV & CPX> Fred [name] has had a good year, no new complaints or concerns except his insurance co difficulty w/ Nexium40mg /d & he will check w/ [name] & 30d vs 90d via his InsurCo...    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 CXR is unchanged...    Chol> on diet alone; he had recent business trip & is worried his diet wasn't what it should have been; FLP showed TChol 198, TG 99, HDL 47, LDL 131    GERD> on Nexium40; Protonix wasn't helpful & he is hopeful that his insurance co will cover the needed Nexium...    Colon Polyp & +FamHx colon ca> last colon 1/10 w/ divertics & tiny polyp, f/u due 1/15 and he remains asymptomatic...    Prostate cancer> s/p robotic prostatectomy 5/10 & DrChapman in [place] released him 9/12 & asked [name] to monitor PSA yearly; Labs today showed PSA= 0.00 We reviewed prob list, meds, xrays and labs> see below for updates >> OK 2013 Flu vaccine today... 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 LABS 10/13:  FLP- ok x LDL=131 on diet alone;  Chems- wnl;  CBC- wnl;  TSH=0.79;  PSA=0.00;  Urine- clear...           Problem List:  ALLERGY (ICD-995.3) - uses OTC antihistamines Prn...  COPD (ICD-496) & EMPHYSEMATOUS BLEB (ICD-492.0) -  on ADVAIR 250 Bid & SPIRIVA daily... unfortunately he still smokes a pipe daily (w/ min cough, drainage, etc)... he has a neg FamHx of lung disease and a normal A1AT level (MM phenotype)... he is s/p VATS- RUL bullectomy in 1997 for infected bulla w/ A/F level. ~  baseline CXR's back to 1990 showed large bilat bullae R>L & scarring at left base... ~  f/u CXR's w/ vol loss on right from the surg, bullae on the left, scarring, etc... ~  prev CT Chest revealed mult blebs & bullae in RUL & LUL to the lingular w/ scarring... 1997= 14cm RUL bulla w/ AF level (required VATS Bullectomy- path= emphysema, infected bulla w/ acute/ chr inflamm). ~  CT Chest 10/09 in [place]= similar bullous dis on left w/ emphysema, post-op  changes on right, NAD,etc. ~  PFT 6/00 showed FVC= 4.41 (84%), FEV1= 2.53 (59%), %1sec= 57, mid-flows= 25%pred... ~  PFT 6/06 showed FVC= 4.59 (87%), FEV1= 2.79 (65%), %1sec= 61, mid-flows= 27%pred... ~  CXR 5/11 showed advanced chr lung dis w/ hyperinflation, scarring, decr vol on right> NAD. ~  PFT 9/12 showed FVC= 4.07 (79%), FEV1= 2.34 (57%), %1sec= 57, mid-flows= 24%pred... MUST QUIT ALL SMOKING! ~  CXR 9/12 showed no signif change in his bullous lung dis & evid of prev surg on the right... Asked again to quit the pipe. 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 Must quit all smoking!  Hx of CHEST PAIN, ATYPICAL (ICD-786.59) - on ASA 81mg /d... hx intermittent chest wall tightness/ spasm/ ?palpit/ numb in left arm- last 2-3 min and resolves spont, not exercise induced... s/p cardiac eval 2/08 by DrCooper- he wanted to do treadmill and holter but pt never did these and states he is doing fine w/o any incr in symptoms... ~  EKG 5/11 showed NSR 74/min, IVCD, NAD (no ch from old tracings) ~  EKG 9/12 showed NSR 68/min, IVCD, NAD & no change...  HYPERCHOLESTEROLEMIA, MILD (ICD-272.0) - on diet alone... ~  FLP 12/07 showed TChol 182, TG 80, HDL 36, LDL 130... he prefers diet Rx. ~  FLP 12/09 showed TChol 172, TG 81, HDL 36, LDL 120... rec- improve diet efforts, or consider statin. ~  FLP 5/11 showed TChol 169, TG 94, HDL 37, LDL 113... continue diet efforts. ~  FLP 9/12 showed TChol 172, TG 52, HDL 43, LDL 118 ~  FLP 10/13 on diet alone showed TChol 198, TG 99, HDL 47, LDL 131... Offered Crestor5mg /d, he will decide.  GERD (ICD-530.81) - on PROTONIX 40mg /d... he states he tried off PPI meds but had to restart due to signif heartburn... ~  9/12:  He reports that insurance co required switch off Nexium onto Protonix but the latter isn't working w/ return of severe reflux symptoms & he wants to restart the NEXIUM 40mg /d... ~  10/13:  Doing satis back on the  Nexium, but trouble w/ coverage thru his insurance...  COLONIC POLYPS (ICD-211.3) & Family Hx of ADENOCARCINOMA, COLON (ICD-153.9)  - colonoscopy 7/01 by DrPatterson showed diminutive rectal polyp= hyperplastic... f/u planned 74yrs but pt cancelled and never rescheduled the procedure... f/u colonoscopy done 1/10 showed divertics & tiny polyp w/o adenomatous change (+FamHx of colon cancer in brother who died of the disease at age 76)... Repeat due 1/15.  ADENOCARCINOMA, PROSTATE (ICD-185) >> ~  5/11:  when seen 12/09 his routine PSA was 5.98 & he was referred to [facility]Chapman in [facility] where he lives for eval> +  prostate cancer and s/p robotic prostatectomy 5/10 & doing well so far w/ PSA <0.01 on last check 11/10 w/ f/u due soon (Q73mo). ~  He had episode of micturition syncope that occurred one night- awoke w/ full bladder & was standing to urinate, then fell w/ transient syncope, injured tooth & fx rib; recovered uneventfully & he is asked to sit down to empty bladder, stand slowly & be careful> no recurrence. ~  9/12:  He tells me that DrChapman "released me" & he wants [name] to do yearly PSA f/u here; PSA today = zero... 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  ANXIETY (ICD-300.00)  Health Maintenance >> ~  GI:  His brother died age 10 w/ colon cancer; pt had hyperplastic polyp in past; last colonoscopy was 1/10 by DrPatterson- neg, f/u planned 63yrs. ~  GU:  Hx prostate cancer Dx 2010 w/ Bx DrChapman in [place]; s/p robotic surg 5/10 & doing well since then; PSA= zero ~  Immuniz:  He had PNEUMOVAX in 1999, and gets yearly flu shots... Pneumovax f/u vaccination given 12/09... Given TDAP 5/11   Past Surgical History  Procedure Date  . Other surgical history     S/P VATS w/ infected bleb removed from RUL 1997  . Cholecystectomy 2004  . Prostatectomy 11/2008    S/P robotic  prostatectomy in [facility]    Outpatient Encounter Prescriptions as of 05/01/2012  Medication Sig Dispense Refill  . aspirin 325 MG tablet Take 325 mg by mouth daily.        Marland Kitchen esomeprazole (NEXIUM) 40 MG capsule Take 1 capsule (40 mg total) by mouth daily before breakfast.  30 capsule  11  . Fluticasone-Salmeterol (ADVAIR DISKUS) 250-50 MCG/DOSE AEPB Inhale 1 puff into the lungs 2 (two) times daily.  180 each  3  . Multiple Vitamin (MULTIVITAMIN) tablet Take 1 tablet by mouth daily.        Marland Kitchen tiotropium (SPIRIVA) 18 MCG inhalation capsule Place 1 capsule (18 mcg total) into inhaler and inhale daily.  90 capsule  3  . DISCONTD: ADVAIR DISKUS 250-50 MCG/DOSE AEPB USE 1 INHALATION ORALLY    TWICE DAILY, RINSE MOUTH   AFTER USE  180 each  0  . DISCONTD: esomeprazole (NEXIUM) 40 MG capsule Take 1 capsule (40 mg total) by mouth daily before breakfast.  90 capsule  3  . DISCONTD: esomeprazole (NEXIUM) 40 MG capsule Take 1 capsule (40 mg total) by mouth daily before breakfast.  90 capsule  3  . DISCONTD: Fluticasone-Salmeterol (ADVAIR DISKUS) 250-50 MCG/DOSE AEPB Inhale 1 puff into the lungs 2 (two) times daily.  28 each  0  . DISCONTD: tiotropium (SPIRIVA) 18 MCG inhalation capsule Place 1 capsule (18 mcg total) into inhaler and inhale daily.  90 capsule  3  . DISCONTD: tiotropium (SPIRIVA) 18 MCG inhalation capsule Place 1 capsule (18 mcg total) into inhaler and inhale daily.  20 capsule  0  . DISCONTD: Multiple Vitamins-Minerals (PROTEGRA CARDIO PO) Take 1 tablet by mouth daily.          No Known Allergies   Current Medications, Allergies, Past Medical History, Past Surgical History, Family History, and Social History were reviewed in [employer] record.     Review of Systems        The patient complains of dyspnea on exertion and cough.  The patient denies fever,  chills, sweats, anorexia, fatigue, weakness, malaise, weight loss, sleep disorder, blurring, diplopia, eye irritation,  eye discharge, vision loss, eye pain, photophobia, earache, ear discharge, tinnitus, decreased hearing, nasal congestion, nosebleeds, sore throat, hoarseness, chest pain, palpitations, syncope, orthopnea, PND, peripheral edema, dyspnea at rest, excessive sputum, hemoptysis, wheezing, pleurisy, nausea, vomiting, diarrhea, constipation, change in bowel habits, abdominal pain, melena, hematochezia, jaundice, gas/bloating, indigestion/heartburn, dysphagia, odynophagia, dysuria, hematuria, urinary frequency, urinary hesitancy, nocturia, incontinence, back pain, joint pain, joint swelling, muscle cramps, muscle weakness, stiffness, arthritis, sciatica, restless legs, leg pain at night, leg pain with exertion, rash, itching, dryness, suspicious lesions, paralysis, paresthesias, seizures, tremors, vertigo, transient blindness, frequent falls, frequent headaches, difficulty walking, depression, anxiety, memory loss, confusion, cold intolerance, heat intolerance, polydipsia, polyphagia, polyuria, unusual weight change, abnormal bruising, bleeding, enlarged lymph nodes, urticaria, allergic rash, hay fever, and recurrent infections.     Objective:   Physical Exam    WD, WN, 56 y/o WM in NAD... GENERAL:  Alert & oriented; pleasant & cooperative... HEENT:  [place]/AT, EOM-wnl, PERRLA, Fundi-benign, EACs-clear, TMs-wnl, NOSE-clear, THROAT-clear & wnl. NECK:  Supple w/ fairROM; no JVD; normal carotid impulses w/o bruits; no thyromegaly or nodules palpated; no lymphadenopathy. CHEST:  decr BS bilat, clear to P & A; without wheezes/ rales/ or rhonchi heard... HEART:  Regular Rhythm; without murmurs/ rubs/ or gallops. ABDOMEN:  Soft & nontender; normal bowel sounds; no organomegaly or masses detected. EXT: without deformities or arthritic changes; no varicose veins/ venous insuffic/ or edema. NEURO:  CN's intact; motor testing normal; sensory testing normal; gait normal & balance OK. DERM:  No lesions noted; no rash  etc...  RADIOLOGY DATA:  Reviewed in the EPIC EMR & discussed w/ the patient...  LABORATORY DATA:  Reviewed in the EPIC EMR & discussed w/ the patient...   Assessment & Plan:    CPX>  He is 57 y/o and has had a number of serious health issues; MUST QUIT ALL SMOKING...  COPD/ Bullous Emphysema>  He is s/p RUL bullectomy1997 for an infected bulla w/ A/F level; A1AT prev measured and wnl/ MM phenotype; PFT w/ mod airflow obstruction; must quit all smoking... Continue ADVAIR & SPIRIVA, Mucinex as needed, etc...  Hx AtypCP>  No further chest discomfort; EKG w/ NSR, IVCD, NAD...  Borderline CHOL>  LDL not at goal but he is not inclined to take statin Rx; therefore diet alone... Offered Cres5.  GERD>  He has signif symptoms and NOT improved w/ Protonix; NEXIUM is the only PPI that works for him & we will re-write this med...  Colon Polyps>  As noted brother died age 54 w/ colon cancer; last colonoscopy 1/10 was ok & f/u planned 4yrs...  PROSTATE CANCER> DrChapman in [place] has released him & asked that we do yearly PSA; todays lab w/ PSA= zero... NOTE episode of micturition syncope w/o recurrence...  DJD>  Stable, uses OTC analgesics as needed...  Anxiety>  Aware, stable, no meds required...   Patient's Medications  New Prescriptions   No medications on file  Previous Medications   ASPIRIN 325 MG TABLET    Take 325 mg by mouth daily.     MULTIPLE VITAMIN (MULTIVITAMIN) TABLET    Take 1 tablet by mouth daily.    Modified Medications   Modified Medication Previous Medication   ESOMEPRAZOLE (NEXIUM) 40 MG CAPSULE esomeprazole (NEXIUM) 40 MG capsule      Take 1 capsule (40 mg total) by mouth daily before breakfast.    Take 1 capsule (40 mg total)  by mouth daily before breakfast.   FLUTICASONE-SALMETEROL (ADVAIR DISKUS) 250-50 MCG/DOSE AEPB ADVAIR DISKUS 250-50 MCG/DOSE AEPB      Inhale 1 puff into the lungs 2 (two) times daily.    USE 1 INHALATION ORALLY    TWICE DAILY, RINSE MOUTH    AFTER USE   TIOTROPIUM (SPIRIVA) 18 MCG INHALATION CAPSULE tiotropium (SPIRIVA) 18 MCG inhalation capsule      Place 1 capsule (18 mcg total) into inhaler and inhale daily.    Place 1 capsule (18 mcg total) into inhaler and inhale daily.  Discontinued Medications   MULTIPLE VITAMINS-MINERALS (PROTEGRA CARDIO PO)    Take 1 tablet by mouth daily.

## 2013-05-30 ENCOUNTER — Other Ambulatory Visit: Payer: Self-pay | Admitting: Pulmonary Disease

## 2013-05-31 ENCOUNTER — Telehealth: Payer: Self-pay | Admitting: Pulmonary Disease

## 2013-05-31 MED ORDER — ESOMEPRAZOLE MAGNESIUM 40 MG PO CPDR: 40.0000 mg | DELAYED_RELEASE_CAPSULE | Freq: Every day | ORAL | Status: DC

## 2013-05-31 NOTE — Telephone Encounter (Signed)
I called and spoke with [facility]. nexium 40 mg is requiring PA. Pt is needing to try and fail omperazole. Please advise Dr. Kriste Basque thanks Last OV 05/11/2012 Pending 08/04/13 No Known Allergies

## 2013-05-31 NOTE — Telephone Encounter (Signed)
I spoke with pt. He is requesting we do a PA. I called [facility] and was giving # [phone] member ID # [id number]. I called [facility]. I initiated PA. This was approved from 04/30/13-06/01/2015. Case #: [id number].  I called [facility] to make aware of approval. I called pt back as well and he needed nothing further

## 2013-05-31 NOTE — Telephone Encounter (Signed)
Per SN---  If he wants the nexium he will have to pay out of pocket for this while we do the PA or we  Can call in omeprazole which his insurance requires him to try for the next 30 days.  If we do the PA for the nexium this will take a few days to get the denial or approval back.  What does he want to do?

## 2013-05-31 NOTE — Telephone Encounter (Signed)
I spoke with pt. Confirmed RX needing and requesting 90 days. He is aware to keep pending appt with SN. Nothing further needed

## 2013-05-31 NOTE — Telephone Encounter (Signed)
Pt calling again in ref to previous msg says he's leaving town tomorrow afternoon and would like to have his rx before that if possible.Raylene Everts

## 2013-05-31 NOTE — Telephone Encounter (Signed)
Returning call.Fred [name] ° °

## 2013-05-31 NOTE — Telephone Encounter (Signed)
[id number] 

## 2013-06-03 ENCOUNTER — Other Ambulatory Visit: Payer: Self-pay

## 2013-06-11 ENCOUNTER — Encounter: Payer: Self-pay | Admitting: Gastroenterology

## 2013-08-04 ENCOUNTER — Ambulatory Visit: Payer: BC Managed Care – PPO | Admitting: Pulmonary Disease

## 2013-10-01 ENCOUNTER — Telehealth: Payer: Self-pay | Admitting: Pulmonary Disease

## 2013-10-01 MED ORDER — PREDNISONE (PAK) 5 MG PO TABS: ORAL_TABLET | ORAL | Status: DC

## 2013-10-01 NOTE — Telephone Encounter (Signed)
Leigh please advise where pt can be added on since his appt needs to be r/s. thanks

## 2013-10-01 NOTE — Telephone Encounter (Signed)
Called and rescheduled appt with pt to 4-3 at 11.  Pt is aware.   Pt stated that since before christmas he has had a lot of congestion and cough with clear sputum.  Pt has been using the mucinex.   Pt stated that this gets better but then comes right back.  Pt is requesting something be called to the pharmacy.    Per SN--  Ok to send in pred taper and have pt call back after done to see how he is doing.  Pt is aware of this and med has been sent to the pharmacy.  Nothing further is needed.

## 2013-10-06 ENCOUNTER — Ambulatory Visit: Payer: BC Managed Care – PPO | Admitting: Pulmonary Disease

## 2013-10-15 ENCOUNTER — Telehealth: Payer: Self-pay | Admitting: Pulmonary Disease

## 2013-10-15 NOTE — Telephone Encounter (Signed)
Spoke with pt. He reports he feels better but still congested, PND, prod cough w/ clear phlem, occas wheezing. Pt was giving prednisone on 10/01/13. Pt has pending appt 11/03/13. Pt is taking mucinex as needed. Please advise any further recs SN? Thanks  No Known Allergies

## 2013-10-15 NOTE — Telephone Encounter (Signed)
Per SN---  Cont advair bid  cont spiriva daily No pipe smoking Add mucinex 600 mg  2 po bid Increase fluids  For the post nasal drip--try antihistamine   Like zyrtec 10 mg  1 daily otc.  thanks

## 2013-10-15 NOTE — Telephone Encounter (Signed)
lmomtcb x1 for ptlmtcb x1 on work and home #

## 2013-10-18 NOTE — Telephone Encounter (Signed)
Pt advised. Suella Cogar, CMA  

## 2013-10-29 ENCOUNTER — Ambulatory Visit: Payer: BC Managed Care – PPO | Admitting: Pulmonary Disease

## 2013-11-01 ENCOUNTER — Telehealth: Payer: Self-pay | Admitting: Pulmonary Disease

## 2013-11-01 DIAGNOSIS — F411 Generalized anxiety disorder: Secondary | ICD-10-CM

## 2013-11-01 DIAGNOSIS — C61 Malignant neoplasm of prostate: Secondary | ICD-10-CM

## 2013-11-01 DIAGNOSIS — D126 Benign neoplasm of colon, unspecified: Secondary | ICD-10-CM

## 2013-11-01 DIAGNOSIS — E7800 Pure hypercholesterolemia, unspecified: Secondary | ICD-10-CM

## 2013-11-01 NOTE — Telephone Encounter (Signed)
Called and spoke with pt and he is aware of labs that have been placed in the computer and pt will come in wed morning for these. Nothing further is needed.

## 2013-11-03 ENCOUNTER — Ambulatory Visit (INDEPENDENT_AMBULATORY_CARE_PROVIDER_SITE_OTHER): Payer: BC Managed Care – PPO | Admitting: Pulmonary Disease

## 2013-11-03 ENCOUNTER — Encounter: Payer: Self-pay | Admitting: Pulmonary Disease

## 2013-11-03 ENCOUNTER — Ambulatory Visit (INDEPENDENT_AMBULATORY_CARE_PROVIDER_SITE_OTHER)
Admission: RE | Admit: 2013-11-03 | Discharge: 2013-11-03 | Disposition: A | Discharge status: Home / Self Care | Payer: 59 | Source: Ambulatory Visit | Attending: Pulmonary Disease | Admitting: Pulmonary Disease

## 2013-11-03 ENCOUNTER — Other Ambulatory Visit (INDEPENDENT_AMBULATORY_CARE_PROVIDER_SITE_OTHER): Payer: 59

## 2013-11-03 VITALS — BP 110/70 | HR 84 | Temp 97.9°F | Ht 72.0 in | Wt 185.2 lb

## 2013-11-03 DIAGNOSIS — F411 Generalized anxiety disorder: Secondary | ICD-10-CM

## 2013-11-03 DIAGNOSIS — E7800 Pure hypercholesterolemia, unspecified: Secondary | ICD-10-CM

## 2013-11-03 DIAGNOSIS — C61 Malignant neoplasm of prostate: Secondary | ICD-10-CM

## 2013-11-03 DIAGNOSIS — J439 Emphysema, unspecified: Secondary | ICD-10-CM

## 2013-11-03 DIAGNOSIS — K219 Gastro-esophageal reflux disease without esophagitis: Secondary | ICD-10-CM

## 2013-11-03 DIAGNOSIS — M1990 Unspecified osteoarthritis, unspecified site: Secondary | ICD-10-CM

## 2013-11-03 DIAGNOSIS — D126 Benign neoplasm of colon, unspecified: Secondary | ICD-10-CM

## 2013-11-03 DIAGNOSIS — J449 Chronic obstructive pulmonary disease, unspecified: Secondary | ICD-10-CM

## 2013-11-03 DIAGNOSIS — J4489 Other specified chronic obstructive pulmonary disease: Secondary | ICD-10-CM

## 2013-11-03 DIAGNOSIS — F17290 Nicotine dependence, other tobacco product, uncomplicated: Secondary | ICD-10-CM

## 2013-11-03 DIAGNOSIS — F17200 Nicotine dependence, unspecified, uncomplicated: Secondary | ICD-10-CM

## 2013-11-03 LAB — CBC WITH DIFFERENTIAL/PLATELET
BASOS ABS: 0 10*3/uL (ref 0.0–0.1)
BASOS PCT: 0.3 % (ref 0.0–3.0)
Eosinophils Absolute: 0.1 10*3/uL (ref 0.0–0.7)
Eosinophils Relative: 1.3 % (ref 0.0–5.0)
HCT: 42.8 % (ref 39.0–52.0)
Hemoglobin: 14.4 g/dL (ref 13.0–17.0)
LYMPHS PCT: 28.8 % (ref 12.0–46.0)
Lymphs Abs: 2.1 10*3/uL (ref 0.7–4.0)
MCHC: 33.7 g/dL (ref 30.0–36.0)
MCV: 90.8 fl (ref 78.0–100.0)
MONOS PCT: 7.7 % (ref 3.0–12.0)
Monocytes Absolute: 0.6 10*3/uL (ref 0.1–1.0)
Neutro Abs: 4.5 10*3/uL (ref 1.4–7.7)
Neutrophils Relative %: 61.9 % (ref 43.0–77.0)
Platelets: 297 10*3/uL (ref 150.0–400.0)
RBC: 4.71 Mil/uL (ref 4.22–5.81)
RDW: 13.1 % (ref 11.5–14.6)
WBC: 7.3 10*3/uL (ref 4.5–10.5)

## 2013-11-03 LAB — URINALYSIS
Bilirubin Urine: NEGATIVE
Hgb urine dipstick: NEGATIVE
Ketones, ur: NEGATIVE
LEUKOCYTES UA: NEGATIVE
NITRITE: NEGATIVE
PH: 7 (ref 5.0–8.0)
SPECIFIC GRAVITY, URINE: 1.01 (ref 1.000–1.030)
TOTAL PROTEIN, URINE-UPE24: NEGATIVE
Urine Glucose: NEGATIVE
Urobilinogen, UA: 0.2 (ref 0.0–1.0)

## 2013-11-03 LAB — LIPID PANEL
CHOLESTEROL: 174 mg/dL (ref 0–200)
HDL: 42.8 mg/dL (ref >39.00)
LDL Cholesterol: 120 mg/dL — ABNORMAL HIGH (ref 0–99)
TRIGLYCERIDES: 55 mg/dL (ref 0.0–149.0)
Total CHOL/HDL Ratio: 4
VLDL: 11 mg/dL (ref 0.0–40.0)

## 2013-11-03 LAB — BASIC METABOLIC PANEL
BUN: 13 mg/dL (ref 6–23)
CALCIUM: 9.2 mg/dL (ref 8.4–10.5)
CHLORIDE: 105 meq/L (ref 96–112)
CO2: 28 meq/L (ref 19–32)
CREATININE: 0.7 mg/dL (ref 0.4–1.5)
GFR: 130.06 mL/min (ref >60.00)
GLUCOSE: 87 mg/dL (ref 70–99)
Potassium: 4 mEq/L (ref 3.5–5.1)
Sodium: 139 mEq/L (ref 135–145)

## 2013-11-03 LAB — HEPATIC FUNCTION PANEL
ALBUMIN: 4 g/dL (ref 3.5–5.2)
ALT: 20 U/L (ref 0–53)
AST: 22 U/L (ref 0–37)
Alkaline Phosphatase: 69 U/L (ref 39–117)
Bilirubin, Direct: 0.1 mg/dL (ref 0.0–0.3)
TOTAL PROTEIN: 6.6 g/dL (ref 6.0–8.3)
Total Bilirubin: 0.8 mg/dL (ref 0.3–1.2)

## 2013-11-03 LAB — PSA: PSA: 0 ng/mL — AB (ref 0.10–4.00)

## 2013-11-03 LAB — TSH: TSH: 0.85 u[IU]/mL (ref 0.35–5.50)

## 2013-11-03 MED ORDER — AZELASTINE HCL 0.1 % NA SOLN: NASAL | Status: DC

## 2013-11-03 NOTE — Progress Notes (Signed)
Subjective:    Patient ID: Fred [name], male    DOB: [birth year], 57 y.o.   MRN: [id number]  HPI 58 y/o WM here for a follow up visit... he has multiple medical problems as noted below...  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  ~  Dec 22, 2009:  when last seen 12/09 his routine PSA was 5.98 & he was referred to [facility] in [facility] where he lives for eval> +prostate cancer and s/p robotic prostatectomy 5/10 & doing well so far w/ PSA <0.01 on last check 11/10 w/ f/u due soon (Q31mo)... no new complaints or concerns>  he has bullous emphysema and normal A1AT level & phenotype (MM), unfortunately still smokes an occas pipe & we discussed this again today... he also had screening colonoscopy 1/10 by DrPatterson= divertics, tiny polyp w/o adenomatous change & f/u planned 38yrs... stable on current med regimen> refilled meds + TDAP vaccine today.  ~  April 26, 2011:  Yearly ROV & CPX> Fred [name] continues to do well w/ f/u PSA at the zero number & he says that [place] asked that we check his PSA as well; he had an episode of micturition syncope that occurred last yr after arising one night to empty full bladder & he hit the deck w/ tooth injury & fx rib; he is c/o some reflux symptoms which he blames on the fact that his insurance co required switch from Nexium which worked to [employer] which is not working (we will switch back); he still smokes a pipe daily despite his bullous emphysema & the risk to his lungs, we discussed this again today, repeated CXR & PFT, & begged him to quit all smoking... SEE PROB LIST BELOW >>  ~  May 01, 2102:  [place] CPX> Fred [name] has had a good year, no new complaints or concerns except his insurance co difficulty w/ Nexium40mg /d & he will check w/ Fred [name]'s & 30d vs 90d via his InsurCo...    COPD, Emphysematous bleb, s/p RUL bullectomy 1997 for infected bleb w/ A/F level, pipe smoker> on  Advair250, Spiriva; no intercurrent infections, min cough, no sput, no hemoptysis, stable DOE w/o change, no edema, etc; he reports cut back on pipe smoking to ~50% of former level & asked to decr further & work on smoking cessation! CXR is unchanged...    Chol> on diet alone; he had recent business trip & is worried his diet wasn't what it should have been; FLP showed TChol 198, TG 99, HDL 47, LDL 131    GERD> on Nexium40; Protonix wasn't helpful & he is hopeful that his insurance co will cover the needed Nexium...    Colon Polyp & +FamHx colon ca> last colon 1/10 w/ divertics & tiny polyp, f/u due 1/15 and he remains asymptomatic...    Prostate cancer> s/p robotic prostatectomy 5/10 & DrChapman in [place] released him 9/12 & asked [name] to monitor PSA yearly; Labs today showed PSA= 0.00 We reviewed prob list, meds, xrays and labs> see below for updates >> OK 2013 Flu vaccine today...  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  LABS 10/13:  FLP- ok x LDL=131 on diet alone;  Chems- wnl;  CBC- wnl;  TSH=0.79;  PSA=0.00;  Urine- clear...  ~  November 03, 2013:  55mo ROV & Fred [name] reports a good interval w/o new complaints or concerns; getting regular exercise (retired)  and feeling well- he notes some troublesome sinus drainage and recent "cold" for which we called in a Pred dosepak but he notes min change; we discussed Rx w/ OTC Antihist, Nasal saline mist Q1-2h, Mucinex- 2Bid, Fluticasone 2sp each nostril Qhs, and Rx for Astelin- 2sp each nostril Bid as needed; he still smokes a pipe regularly & again asked to quit! Explained that he might need ENT eval in [facility] if symptoms don't abate... We reviewed the following medical problems during today's office visit >>     COPD, Emphysematous bleb, s/p RUL bullectomy 1997 for infected bleb w/ A/F level, pipe smoker> on Advair250, Spiriva; no intercurrent infections, min cough & sput, no hemoptysis, stable DOE w/o  change, no edema, etc; he reports cut back on pipe smoking to ~50% of former level & asked to decr further & work on smoking cessation! CXR is stable, unchanged...    Chol> on diet alone & his weight is good; FLP 4/15 showed TChol 174, TG 55, HDL 43, LDL 120; he does not want meds despite not being optimal, reviewed diet...    GERD> on Nexium40; Protonix wasn't helpful; he denies adb pain, dysphagia, n/v, c/d, blood seen...     Colon Polyp & +FamHx colon ca (bro died at 52)> last colon 08-13-2022 w/ divertics & tiny polyp, f/u due 1/15 and he remains asymptomatic (he will get this in [facility])...    Prostate cancer> s/p robotic prostatectomy 5/10 & DrChapman in [place] released him 9/12 & asked [name] to monitor PSA yearly; Labs today showed PSA= 0.00 We reviewed prob list, meds, xrays and labs> see below for updates >> he did not get the 2014 Flu vaccine last fall & reminded to get this every yr at local pharm...  CXR 4/15 showed norm heart size, vol loss on right w/ pleuroparenchymal scarring, hyperinflation on left w/ bullous emphysema, stable- NAD...  LABS 4/15:  FLP- ok but not optimal w/ LDL=120 (on diet);  Chems- wnl;  CBC- wnl;  TSH=0.85;  PSA= 0.0            Problem List:  ALLERGY (ICD-995.3) - uses OTC antihistamines Prn...  COPD (ICD-496) & EMPHYSEMATOUS BLEB (ICD-492.0) - on [place] daily... unfortunately he still smokes a pipe daily (w/ min cough, drainage, etc)... he has a neg FamHx of lung disease and a normal A1AT level (MM phenotype)... he is s/p VATS- RUL bullectomy in 1997 for infected bulla w/ A/F level. ~  baseline CXR's back to 1990 showed large bilat bullae R>L & scarring at left base... ~  f/u CXR's w/ vol loss on right from the surg, bullae on the left, scarring, etc... ~  prev CT Chest revealed mult blebs & bullae in RUL & LUL to the lingular w/ scarring...  ~  1997= 14cm RUL bulla w/ AF level (required VATS Bullectomy- path= emphysema, infected bulla w/ acute/ chr  inflamm). ~  CT Chest 10/09 in [place]= similar bullous dis on left w/ emphysema, post-op changes on right, NAD,etc. ~  PFT 6/00 showed FVC= 4.41 (84%), FEV1= 2.53 (59%), %1sec= 57, mid-flows= 25%pred... ~  PFT 6/06 showed FVC= 4.59 (87%), FEV1= 2.79 (65%), %1sec= 61, mid-flows= 27%pred... ~  CXR 5/11 showed advanced chr lung dis w/ hyperinflation, scarring, decr vol on right> NAD. ~  PFT 9/12 showed FVC= 4.07 (79%), FEV1= 2.34 (57%), %1sec= 57, mid-flows= 24%pred... c/w GOLD Stage2 COPD- MUST QUIT ALL SMOKING! ~  CXR 9/12 showed no signif change in his bullous lung  dis & evid of prev surg on the right... Asked again to quit the pipe. 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 Must quit all smoking! ~  CXR 4/15 showed norm heart size, vol loss on right w/ pleuroparenchymal scarring, hyperinflation on left w/ bullous emphysema, stable- NAD...  Hx of CHEST PAIN, ATYPICAL (ICD-786.59) - on ASA 81mg /d... hx intermittent chest wall tightness/ spasm/ ?palpit/ numb in left arm- last 2-3 min and resolves spont, not exercise induced... s/p cardiac eval 2/08 by DrCooper- he wanted to do treadmill and holter but pt never did these and states he is doing fine w/o any incr in symptoms... ~  EKG 5/11 showed NSR 74/min, IVCD, NAD (no ch from old tracings) ~  EKG 9/12 showed NSR 68/min, IVCD, NAD & no change...  HYPERCHOLESTEROLEMIA, MILD (ICD-272.0) - on diet alone... ~  [place] 12/07 showed TChol 182, TG 80, HDL 36, LDL 130... he prefers diet Rx. ~  [place] 12/09 showed TChol 172, TG 81, HDL 36, LDL 120... rec- improve diet efforts, or consider statin. ~  FLP 5/11 showed TChol 169, TG 94, HDL 37, LDL 113... continue diet efforts. ~  FLP 9/12 showed TChol 172, TG 52, HDL 43, LDL 118 ~  FLP 10/13 on diet alone showed TChol 198, TG 99, HDL 47, LDL 131... Offered Crestor5mg /d, he will decide. ~  FLP 4/15 on diet alone showed TChol 174, TG 55, HDL 43, LDL 120... He prefers diet/ exercise &  declined low dose statin rx.  GERD (ICD-530.81) - on NEXIUM 40mg /d... he states he tried off PPI meds but had to restart due to signif heartburn... ~  9/12:  He reports that insurance co required switch off Nexium onto Protonix but the latter isn't working w/ return of severe reflux symptoms & he wants to restart the [place] 40mg /d... ~  10/13:  Doing satis back on the Nexium, but trouble w/ coverage thru his insurance... ~  4/15:  He continues stable on the Nexium40...  COLONIC POLYPS (ICD-211.3) & Family Hx of ADENOCARCINOMA, COLON (ICD-153.9)  - colonoscopy 7/01 by DrPatterson showed diminutive rectal polyp= hyperplastic... f/u planned 64yrs but pt cancelled and never rescheduled the procedure... f/u colonoscopy done 1/10 showed divertics & tiny polyp w/o adenomatous change (+FamHx of colon cancer in brother who died of the disease at age 38)... Repeat due 1/15. ~  4/15:  He knows that he is due for f/u colonoscopy & will get this from a gastroenterologist in [facility]...  ADENOCARCINOMA, PROSTATE (ICD-185) >> ~  5/11:  when seen 12/09 his routine PSA was 5.98 & he was referred to [facility] in [facility] where he lives for eval> +prostate cancer and s/p robotic prostatectomy 5/10 & doing well so far w/ PSA <0.01 on last check 11/10 w/ f/u due soon (Q58mo). ~  He had episode of micturition syncope that occurred one night- awoke w/ full bladder & was standing to urinate, then fell w/ transient syncope, injured tooth & fx rib; recovered uneventfully & he is asked to sit down to empty bladder, stand slowly & be careful> no recurrence. ~  9/12:  He tells me that [place] "released me" & he wants [name] to do yearly PSA f/u here; PSA today = zero... ~  10/13:  Clinically stable w/o voiding difficulty, incont, etc; PSA= 0.00 ~  4/15:  He remains clinically stable w/o complaints and f/u PSA remains zero...  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  ANXIETY  (ICD-300.00)  Health Maintenance >> ~  GI:  His brother died age 40 w/ colon cancer; pt had hyperplastic polyp in past; last colonoscopy was 1/10 by DrPatterson- neg, f/u planned 57yrs. ~  GU:  Hx prostate cancer Dx 2010 w/ Bx DrChapman in [place]; s/p robotic surg 5/10 & doing well since then; PSA= zero ~  Immuniz:  He had PNEUMOVAX in 1999, and gets yearly flu shots... Pneumovax f/u vaccination given 12/09... Given TDAP 5/11   Past Surgical History  Procedure Laterality Date  . Other surgical history      S/P VATS w/ infected bleb removed from RUL 1997  . Cholecystectomy  2004  . Prostatectomy  11/2008    S/P robotic prostatectomy in [facility]    Outpatient Encounter Prescriptions as of 11/03/2013  Medication Sig  . aspirin 325 MG tablet Take 325 mg by mouth daily.    Marland Kitchen esomeprazole (NEXIUM) 40 MG capsule Take 1 capsule (40 mg total) by mouth daily before breakfast.  . Fluticasone-Salmeterol (ADVAIR DISKUS) 250-50 MCG/DOSE AEPB Inhale 1 puff into the lungs 2 (two) times daily.  . Multiple Vitamin (MULTIVITAMIN) tablet Take 1 tablet by mouth daily.    Marland Kitchen tiotropium (SPIRIVA) 18 MCG inhalation capsule Place 1 capsule (18 mcg total) into inhaler and inhale daily.  . [DISCONTINUED] NEXIUM 40 MG capsule TAKE ONE CAPSULE BY MOUTH EVERY DAY BEFORE BREAKFAST  . [DISCONTINUED] predniSONE (STERAPRED UNI-PAK) 5 MG TABS tablet Take as directed    No Known Allergies   Current Medications, Allergies, Past Medical History, Past Surgical History, Family History, and Social History were reviewed in [employer] record.     Review of Systems        The patient complains of dyspnea on exertion and cough.  The patient denies fever, chills, sweats, anorexia, fatigue, weakness, malaise, weight loss, sleep disorder, blurring, diplopia, eye irritation, eye discharge, vision loss, eye pain, photophobia, earache, ear discharge, tinnitus, decreased hearing, nasal congestion, nosebleeds, sore  throat, hoarseness, chest pain, palpitations, syncope, orthopnea, PND, peripheral edema, dyspnea at rest, excessive sputum, hemoptysis, wheezing, pleurisy, nausea, vomiting, diarrhea, constipation, change in bowel habits, abdominal pain, melena, hematochezia, jaundice, gas/bloating, indigestion/heartburn, dysphagia, odynophagia, dysuria, hematuria, urinary frequency, urinary hesitancy, nocturia, incontinence, back pain, joint pain, joint swelling, muscle cramps, muscle weakness, stiffness, arthritis, sciatica, restless legs, leg pain at night, leg pain with exertion, rash, itching, dryness, suspicious lesions, paralysis, paresthesias, seizures, tremors, vertigo, transient blindness, frequent falls, frequent headaches, difficulty walking, depression, anxiety, memory loss, confusion, cold intolerance, heat intolerance, polydipsia, polyphagia, polyuria, unusual weight change, abnormal bruising, bleeding, enlarged lymph nodes, urticaria, allergic rash, hay fever, and recurrent infections.     Objective:   Physical Exam    WD, WN, 58 y/o WM in NAD... GENERAL:  Alert & oriented; pleasant & cooperative... HEENT:  [place]/AT, EOM-wnl, PERRLA, Fundi-benign, EACs-clear, TMs-wnl, NOSE-clear, THROAT-clear & wnl. NECK:  Supple w/ fairROM; no JVD; normal carotid impulses w/o bruits; no thyromegaly or nodules palpated; no lymphadenopathy. CHEST:  decr BS bilat, clear to P & A; without wheezes/ rales/ or rhonchi heard... HEART:  Regular Rhythm; without murmurs/ rubs/ or gallops. ABDOMEN:  Soft & nontender; normal bowel sounds; no organomegaly or masses detected. EXT: without deformities or arthritic changes; no varicose veins/ venous insuffic/ or edema. NEURO:  CN's intact; motor testing normal; sensory testing normal; gait normal & balance OK. DERM:  No lesions noted; no rash etc...  RADIOLOGY DATA:  Reviewed in the  EPIC EMR & discussed w/ the patient...  LABORATORY DATA:  Reviewed in the EPIC EMR & discussed w/ the  patient...   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 Continue [place], Mucinex as needed, etc...  Hx AtypCP>  No further chest discomfort; EKG w/ NSR, IVCD, NAD...  Borderline CHOL>  LDL not at goal but he is not inclined to take statin Rx; therefore diet alone...  GERD>  He has signif symptoms and NOT improved w/ Protonix; NEXIUM is the only PPI that works for him & we will re-write this med...  Colon Polyps>  As noted brother died age 3 w/ colon cancer; last colonoscopy 1/10 was ok & f/u due now- he will get this in [facility]...  PROSTATE CANCER> DrChapman in [place] has released him & asked that we do yearly PSA; todays lab w/ PSA= zero... NOTE: hx one episode of micturition syncope w/o recurrence...  DJD>  Stable, uses OTC analgesics as needed...  Anxiety>  Aware, stable, no meds required...   Patient's Medications  New Prescriptions   AZELASTINE (ASTELIN) 137 MCG/SPRAY NASAL SPRAY    1-2 sprays in each nostril BID as needed for drainage  Previous Medications   ASPIRIN 325 MG TABLET    Take 325 mg by mouth daily.     ESOMEPRAZOLE (NEXIUM) 40 MG CAPSULE    Take 1 capsule (40 mg total) by mouth daily before breakfast.   FLUTICASONE-SALMETEROL (ADVAIR DISKUS) 250-50 MCG/DOSE AEPB    Inhale 1 puff into the lungs 2 (two) times daily.   MULTIPLE VITAMIN (MULTIVITAMIN) TABLET    Take 1 tablet by mouth daily.     TIOTROPIUM (SPIRIVA) 18 MCG INHALATION CAPSULE    Place 1 capsule (18 mcg total) into inhaler and inhale daily.  Modified Medications   No medications on file  Discontinued Medications   NEXIUM 40 MG CAPSULE    TAKE ONE CAPSULE BY MOUTH EVERY DAY BEFORE BREAKFAST   PREDNISONE (STERAPRED UNI-PAK) 5 MG TABS TABLET    Take as directed

## 2013-11-03 NOTE — Patient Instructions (Signed)
Today we updated your med list in our EPIC system...    Continue your current medications the same...    We refilled your meds per request...  Today we did your FASTING blood work & follow up CXR...    We will contact you w/ the results when available...   For the sinus drainage>>    Try the combination of an antihistamine like Claritin, Zytrek, or Allegra once daily...    Use a Saline nasal mist every 1-2h to keep the nasal passages clear of pollen etc...    Use FLONASE (Fluticasone) nasal spr- 2 sp in each nostril at bedtime...  We wrote a new prescription for ASTELIN nasal spray as well- try 1-2 sp in each nostril up to twice daily to see if this dries up the drainage...  Remember to get your follow up colonoscopy in [place] at your convenience...  Call for any questions or if we can be of service in any way.Marland KitchenMarland Kitchen

## 2013-11-04 ENCOUNTER — Telehealth: Payer: Self-pay | Admitting: Pulmonary Disease

## 2013-11-04 ENCOUNTER — Encounter: Payer: Self-pay | Admitting: Pulmonary Disease

## 2013-11-04 NOTE — Telephone Encounter (Signed)
Called and spoke with pt and he is aware of cxr results per SN.Marland Kitchen  Pt voiced his understanding and nothing further is needed.

## 2013-11-14 ENCOUNTER — Other Ambulatory Visit: Payer: Self-pay | Admitting: Pulmonary Disease

## 2013-11-23 ENCOUNTER — Other Ambulatory Visit: Payer: Self-pay | Admitting: Pulmonary Disease

## 2013-12-28 ENCOUNTER — Encounter: Payer: Self-pay | Admitting: Gastroenterology

## 2014-05-05 ENCOUNTER — Encounter: Payer: Self-pay | Admitting: Gastroenterology

## 2014-05-23 ENCOUNTER — Other Ambulatory Visit: Payer: Self-pay | Admitting: Pulmonary Disease

## 2014-07-06 ENCOUNTER — Telehealth: Payer: Self-pay | Admitting: Pulmonary Disease

## 2014-07-06 MED ORDER — AMOXICILLIN-POT CLAVULANATE 875-125 MG PO TABS: 1.0000 | ORAL_TABLET | Freq: Two times a day (BID) | ORAL | Status: DC

## 2014-07-06 MED ORDER — PREDNISONE (PAK) 5 MG PO TABS: 5.0000 mg | ORAL_TABLET | ORAL | Status: DC

## 2014-07-06 NOTE — Telephone Encounter (Signed)
Pt returning call.Fred [name] ° °

## 2014-07-06 NOTE — Telephone Encounter (Signed)
Pt c/o sinus pressure, cough with yellow mucus production, some chest tightness with cough, runny nose and PND.  Denies SOB, CP/tightness and fever. Pt states he might have a low grade Pt using Mucinex daily PRN  No Known Allergies [facility] [place] [facility]  Please advise Dr Lenna Gilford. Thanks.    Current Outpatient Prescriptions on File Prior to Visit  Medication Sig Dispense Refill  . ADVAIR DISKUS 250-50 MCG/DOSE AEPB USE 1 INHALATION ORALLY    TWICE DAILY 180 each 3  . aspirin 325 MG tablet Take 325 mg by mouth daily.      Marland Kitchen azelastine (ASTELIN) 137 MCG/SPRAY nasal spray 1-2 sprays in each nostril BID as needed for drainage 30 mL 12  . Multiple Vitamin (MULTIVITAMIN) tablet Take 1 tablet by mouth daily.      Marland Kitchen NEXIUM 40 MG capsule TAKE 1 CAPSULE (40 MG TOTAL) BY MOUTH DAILY BEFORE BREAKFAST. 90 capsule 1  . tiotropium (SPIRIVA) 18 MCG inhalation capsule Place 1 capsule (18 mcg total) into inhaler and inhale daily. 90 capsule 3   No current facility-administered medications on file prior to visit.

## 2014-07-06 NOTE — Telephone Encounter (Signed)
Per SN---  augmentin 875 mg  #14  1 po bid Align once daily mucinex 600 mg  1 tablet QID with fluids Prednisone dosepak  5 mg  6 day pack.

## 2014-07-06 NOTE — Telephone Encounter (Signed)
Called and spoke to pt. Informed pt of the recs per SN. Rx sent to preferred pharmacy. Pt verbalized understanding and denied any further questions or concerns at this time.  

## 2014-07-06 NOTE — Telephone Encounter (Signed)
lmtcb for pt.  

## 2014-11-23 ENCOUNTER — Other Ambulatory Visit: Payer: Self-pay | Admitting: Pulmonary Disease

## 2014-12-24 ENCOUNTER — Other Ambulatory Visit: Payer: Self-pay | Admitting: Pulmonary Disease

## 2015-01-11 ENCOUNTER — Telehealth: Payer: Self-pay | Admitting: Pulmonary Disease

## 2015-01-11 ENCOUNTER — Other Ambulatory Visit: Payer: Self-pay | Admitting: *Deleted

## 2015-01-11 ENCOUNTER — Encounter: Payer: Self-pay | Admitting: Pulmonary Disease

## 2015-01-11 MED ORDER — ESOMEPRAZOLE MAGNESIUM 40 MG PO CPDR: DELAYED_RELEASE_CAPSULE | ORAL | Status: DC

## 2015-01-11 NOTE — Telephone Encounter (Signed)
Rx was sent in earlier to the pharmacy according to epic Pt aware

## 2015-02-03 ENCOUNTER — Ambulatory Visit: Payer: Self-pay | Admitting: Pulmonary Disease

## 2015-02-17 ENCOUNTER — Ambulatory Visit (INDEPENDENT_AMBULATORY_CARE_PROVIDER_SITE_OTHER)
Admission: RE | Admit: 2015-02-17 | Discharge: 2015-02-17 | Disposition: A | Discharge status: Home / Self Care | Payer: 59 | Source: Ambulatory Visit | Attending: Pulmonary Disease | Admitting: Pulmonary Disease

## 2015-02-17 ENCOUNTER — Other Ambulatory Visit (INDEPENDENT_AMBULATORY_CARE_PROVIDER_SITE_OTHER): Payer: 59

## 2015-02-17 ENCOUNTER — Encounter: Payer: Self-pay | Admitting: Pulmonary Disease

## 2015-02-17 ENCOUNTER — Ambulatory Visit (INDEPENDENT_AMBULATORY_CARE_PROVIDER_SITE_OTHER): Payer: 59 | Admitting: Pulmonary Disease

## 2015-02-17 VITALS — BP 102/64 | HR 57 | Temp 97.6°F | Wt 179.2 lb

## 2015-02-17 DIAGNOSIS — M159 Polyosteoarthritis, unspecified: Secondary | ICD-10-CM

## 2015-02-17 DIAGNOSIS — F419 Anxiety disorder, unspecified: Secondary | ICD-10-CM | POA: Diagnosis not present

## 2015-02-17 DIAGNOSIS — F17290 Nicotine dependence, other tobacco product, uncomplicated: Secondary | ICD-10-CM

## 2015-02-17 DIAGNOSIS — E7800 Pure hypercholesterolemia, unspecified: Secondary | ICD-10-CM

## 2015-02-17 DIAGNOSIS — J432 Centrilobular emphysema: Secondary | ICD-10-CM

## 2015-02-17 DIAGNOSIS — J439 Emphysema, unspecified: Secondary | ICD-10-CM

## 2015-02-17 DIAGNOSIS — D126 Benign neoplasm of colon, unspecified: Secondary | ICD-10-CM

## 2015-02-17 DIAGNOSIS — C61 Malignant neoplasm of prostate: Secondary | ICD-10-CM

## 2015-02-17 DIAGNOSIS — Z23 Encounter for immunization: Secondary | ICD-10-CM | POA: Diagnosis not present

## 2015-02-17 DIAGNOSIS — K219 Gastro-esophageal reflux disease without esophagitis: Secondary | ICD-10-CM | POA: Diagnosis not present

## 2015-02-17 DIAGNOSIS — Z720 Tobacco use: Secondary | ICD-10-CM | POA: Diagnosis not present

## 2015-02-17 DIAGNOSIS — M150 Primary generalized (osteo)arthritis: Secondary | ICD-10-CM

## 2015-02-17 DIAGNOSIS — M8949 Other hypertrophic osteoarthropathy, multiple sites: Secondary | ICD-10-CM

## 2015-02-17 LAB — HEPATIC FUNCTION PANEL
ALBUMIN: 4.2 g/dL (ref 3.5–5.2)
ALK PHOS: 75 U/L (ref 39–117)
ALT: 18 U/L (ref 0–53)
AST: 18 U/L (ref 0–37)
Bilirubin, Direct: 0.1 mg/dL (ref 0.0–0.3)
Total Bilirubin: 0.6 mg/dL (ref 0.2–1.2)
Total Protein: 6.3 g/dL (ref 6.0–8.3)

## 2015-02-17 LAB — LIPID PANEL
CHOLESTEROL: 154 mg/dL (ref 0–200)
HDL: 40.9 mg/dL (ref >39.00)
LDL Cholesterol: 97 mg/dL (ref 0–99)
NonHDL: 113.1
TRIGLYCERIDES: 79 mg/dL (ref 0.0–149.0)
Total CHOL/HDL Ratio: 4
VLDL: 15.8 mg/dL (ref 0.0–40.0)

## 2015-02-17 LAB — BASIC METABOLIC PANEL
BUN: 12 mg/dL (ref 6–23)
CALCIUM: 9.2 mg/dL (ref 8.4–10.5)
CO2: 29 mEq/L (ref 19–32)
Chloride: 107 mEq/L (ref 96–112)
Creatinine, Ser: 0.79 mg/dL (ref 0.40–1.50)
GFR: 107.05 mL/min (ref >60.00)
Glucose, Bld: 88 mg/dL (ref 70–99)
POTASSIUM: 4.3 meq/L (ref 3.5–5.1)
Sodium: 141 mEq/L (ref 135–145)

## 2015-02-17 LAB — CBC WITH DIFFERENTIAL/PLATELET
Basophils Absolute: 0 10*3/uL (ref 0.0–0.1)
Basophils Relative: 0.4 % (ref 0.0–3.0)
EOS PCT: 1.2 % (ref 0.0–5.0)
Eosinophils Absolute: 0.1 10*3/uL (ref 0.0–0.7)
HCT: 44.8 % (ref 39.0–52.0)
Hemoglobin: 15.2 g/dL (ref 13.0–17.0)
Lymphocytes Relative: 26.4 % (ref 12.0–46.0)
Lymphs Abs: 2 10*3/uL (ref 0.7–4.0)
MCHC: 33.9 g/dL (ref 30.0–36.0)
MCV: 90.3 fl (ref 78.0–100.0)
MONO ABS: 0.5 10*3/uL (ref 0.1–1.0)
MONOS PCT: 7.2 % (ref 3.0–12.0)
NEUTROS ABS: 4.9 10*3/uL (ref 1.4–7.7)
Neutrophils Relative %: 64.8 % (ref 43.0–77.0)
Platelets: 264 10*3/uL (ref 150.0–400.0)
RBC: 4.96 Mil/uL (ref 4.22–5.81)
RDW: 13.1 % (ref 11.5–15.5)
WBC: 7.5 10*3/uL (ref 4.0–10.5)

## 2015-02-17 LAB — TSH: TSH: 1.1 u[IU]/mL (ref 0.35–4.50)

## 2015-02-17 LAB — PSA: PSA: 0 ng/mL — ABNORMAL LOW (ref 0.10–4.00)

## 2015-02-17 MED ORDER — TADALAFIL 5 MG PO TABS: 5.0000 mg | ORAL_TABLET | Freq: Every day | ORAL | Status: DC

## 2015-02-17 MED ORDER — TIOTROPIUM BROMIDE MONOHYDRATE 18 MCG IN CAPS: 18.0000 ug | ORAL_CAPSULE | Freq: Every day | RESPIRATORY_TRACT | Status: DC

## 2015-02-17 MED ORDER — FLUTICASONE-SALMETEROL 250-50 MCG/DOSE IN AEPB: INHALATION_SPRAY | RESPIRATORY_TRACT | Status: DC

## 2015-02-17 NOTE — Patient Instructions (Signed)
Today we updated your med list in our EPIC system...    Continue your current medications the same...  Today we did a follow up PFT, CXR, and fasting blood work...    We will contact you w/ the results when available...   Fred [name]- you know you need to quit the pipe!!!  Continue your breathing meds regularly every day...  Stay as active as possible...  We gave you the new pneumonia shot (PREVNAR-13) today...  Call for any questions...  Let's plan a follow up visit in 78yr, sooner if needed for problems.Marland KitchenMarland Kitchen

## 2015-02-18 ENCOUNTER — Encounter: Payer: Self-pay | Admitting: Pulmonary Disease

## 2015-02-18 NOTE — Progress Notes (Signed)
Subjective:    Patient ID: Fred [name], male    DOB: [birth year], 59 y.o.   MRN: [id number]  HPI 59 y/o WM here for a follow up visit... he has multiple medical problems as noted below...  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 ~  SEE PREV EPIC NOTES FOR OLDER DATA >>   ~  May 01, 2102:  Fred [name]> Fred [name] has had a good year, no new complaints or concerns except his insurance co difficulty w/ Nexium40mg /d & he will check w/ Fred [name]'s & 30d vs 90d via his [employer]...    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 CXR is unchanged...    Chol> on diet alone; he had recent business trip & is worried his diet wasn't what it should have been; FLP showed TChol 198, TG 99, HDL 47, LDL 131    GERD> on Nexium40; Protonix wasn't helpful & he is hopeful that his insurance co will cover the needed Nexium...    Colon Polyp & +FamHx colon ca> last colon August 31, 2022 w/ divertics & tiny polyp, f/u due 1/15 and he remains asymptomatic...    Prostate cancer> s/p robotic prostatectomy 5/10 & DrChapman in [place] released him 9/12 & asked Fred [name] to monitor PSA yearly; Labs today showed PSA= 0.00 We reviewed prob list, meds, xrays and labs> see below for updates >> OK 2013 Flu vaccine today...  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  LABS 10/13:  FLP- ok x LDL=131 on diet alone;  Chems- wnl;  CBC- wnl;  TSH=0.79;  PSA=0.00;  Urine- clear...  ~  November 03, 2013:  19mo ROV & Whyatt reports a good interval w/o new complaints or concerns; getting regular exercise (retired) and feeling well- he notes some troublesome sinus  drainage and recent "cold" for which we called in a Pred dosepak but he notes min change; we discussed Rx w/ OTC Antihist, Nasal saline mist Q1-2h, Mucinex- 2Bid, Fluticasone 2sp each nostril Qhs, and Rx for Astelin- 2sp each nostril Bid as needed; he still smokes a pipe regularly & again asked to quit! Explained that he might need ENT eval in [facility] if symptoms don't abate... We reviewed the following medical problems during today's office visit >>     COPD, Emphysematous bleb, s/p RUL bullectomy 1997 for infected bleb w/ A/F level, pipe smoker> on Advair250, Spiriva; no intercurrent infections, min cough & sput, no hemoptysis, stable DOE w/o change, no edema, etc; he reports cut back on pipe smoking to ~50% of former level & asked to decr further & work on smoking cessation! CXR is stable, unchanged...    Chol> on diet alone & his weight is good; FLP 4/15 showed TChol 174, TG 55, HDL 43, LDL 120; he does not want meds despite not being optimal, reviewed diet...    GERD> on Nexium40; Protonix wasn't helpful; he denies adb pain, dysphagia, n/v, c/d, blood seen...     Colon Polyp & +FamHx colon ca (bro died at 52)> last colon August 31, 2022 w/ divertics & tiny polyp, f/u due 1/15 and he remains asymptomatic (he will get this  in [facility])...    Prostate cancer> s/p robotic prostatectomy 5/10 & DrChapman in [place] released him 9/12 & asked Fred [name] to monitor PSA yearly; Labs today showed PSA= 0.00 We reviewed prob list, meds, xrays and labs> see below for updates >> he did not get the 2014 Flu vaccine last fall & reminded to get this every yr at local pharm...  CXR 4/15 showed norm heart size, vol loss on right w/ pleuroparenchymal scarring, hyperinflation on left w/ bullous emphysema, stable- NAD...  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;  He reports breathing is doing OK & he had one infectious exac 2/16 (we called in meds); unfortunately he is still smoking a pipe "I enjoy it" but less tobacco than before he says; in addition he has been using his Spiriva "prn" as he freq forgets this dose-- we had a productive conversation about his bullous emphysema/ prev surg/ etc, I indicated that while I am delighted that he remains relatively asymptomatic at this time, that he is more rapidly approaching a life of deteriorating resp function w/ daily symptoms/ chronic resp failure/ need for oxygen 24/7, etc; he understands this & I have made yet another pitch for complete smoking cessation & taking his meds regularly day-in and day-out...     COPD, Emphysematous bleb, s/p RUL bullectomy 1997 for infected bleb w/ A/F level, pipe smoker> on Advair250Bid, Spiriva daily; no intercurrent infections, min cough & sput, no hemoptysis, stable DOE w/o change, no edema, etc; he reports cut back on pipe smoking but must quit completely as above; CXR is stable, unchanged w/ chr changes on right & bullous dis on left...    Chol> on diet alone & his weight is good; FLP 7/16 showed TChol 154, TG 79, HDL 41, LDL 97 => improved, keep up the good work!    GERD> on Nexium40; Protonix wasn't helpful; he denies abd pain, dysphagia, n/v, c/d, blood seen; he tried off the Nexium- symptoms returned therefore back on the PPI daily...    Colon Polyp & +FamHx colon ca (bro died at 52)> last colon 2022-08-22 w/ divertics & tiny polyp, f/u done in [place] in 2015, he reports neg & will get copies sent to Fred [name]     Prostate cancer> s/p robotic prostatectomy 5/10 & DrChapman in [place] released him 9/12 & asked Fred [name] to monitor PSA yearly; Labs today showed PSA= 0.00, requests Cialis for daily use. We reviewed prob list, meds, xrays and labs> see below for updates >> Given PREVNAR-13 today...  CXR  02/17/15 showed norm heart size, severe COPD w/ bullous changes on left & s/p surg/ scarring on right, NAD/ no change from 2015...  Spirometry 02/17/15 showed FVC=3.25 (64%), FEV1=1.82 (46%), %1sec=56, mid-flows reduced at 21% predicted; c/w mod airflow obstruction & GOLD Stage3 COPD (can't r/o superimposed restriction w/o LVs)...  LABS 02/17/15:  FLP- all parameters at goals on diet;  Chems- wnl;  CBC- wnl;  TSH=1.10;  PSA= 0.00  IMP/PLAN>>  PFT shows functional deterioration from last studies, now GOLD Stage3, CXR unchanged, Labs OK;  He knows the score & must quit all smoking, take meds every day, try to avoid  infections, increase exercise program...            Problem List:  ALLERGY (ICD-995.3) - uses OTC antihistamines Prn...  COPD (ICD-496) & EMPHYSEMATOUS BLEB (ICD-492.0) - on [place] daily... unfortunately he still smokes a pipe daily (w/ min cough, drainage, etc)... he has a neg FamHx of lung disease and a normal A1AT level (MM phenotype)... he is s/p VATS- RUL bullectomy in 1997 for infected bulla w/ A/F level. ~  baseline CXR's back to 1990 showed large bilat bullae R>L & scarring at left base... ~  f/u CXR's w/ vol loss on right from the surg, bullae on the left, scarring, etc... ~  prev CT Chest revealed mult blebs & bullae in RUL & LUL to the lingular w/ scarring...  ~  1997= 14cm RUL bulla w/ AF level (required VATS Bullectomy- path= emphysema, infected bulla w/ acute/ chr inflamm). ~  CT Chest 10/09 in [place]= similar bullous dis on left w/ emphysema, post-op changes on right, NAD,etc. ~  PFT 6/00 showed FVC= 4.41 (84%), FEV1= 2.53 (59%), %1sec= 57, mid-flows= 25%pred... ~  PFT 6/06 showed FVC= 4.59 (87%), FEV1= 2.79 (65%), %1sec= 61, mid-flows= 27%pred... ~  CXR 5/11 showed advanced chr lung dis w/ hyperinflation, scarring, decr vol on right> NAD. ~  PFT 9/12 showed FVC= 4.07 (79%), FEV1= 2.34 (57%), %1sec= 57, mid-flows= 24%pred... c/w GOLD Stage2 COPD- MUST QUIT  ALL SMOKING! ~  CXR 9/12 showed no signif change in his bullous lung dis & evid of prev surg on the right... Asked again to quit the pipe. 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 Must quit all smoking! 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 ~  Spirometry 02/17/15 showed FVC=3.25 (64%), FEV1=1.82 (46%), %1sec=56, mid-flows reduced at 21% predicted; c/w mod airflow obstruction & GOLD Stage3 COPD (can't r/o superimposed restriction w/o LVs)...  Hx of CHEST PAIN, ATYPICAL (ICD-786.59) - on ASA 81mg /d... hx intermittent chest wall tightness/ spasm/ ?palpit/ numb in left arm- last 2-3 min and resolves spont, not exercise induced... s/p cardiac eval 2/08 by DrCooper- he wanted to do treadmill and holter but pt never did these and states he is doing fine w/o any incr in symptoms... ~  EKG 5/11 showed NSR 74/min, IVCD, NAD (no ch from old tracings) ~  EKG 9/12 showed NSR 68/min, IVCD, NAD & no change...  HYPERCHOLESTEROLEMIA, MILD (ICD-272.0) - on diet alone... ~  [place] 12/07 showed TChol 182, TG 80, HDL 36, LDL 130... he prefers diet Rx. ~  [place] 12/09 showed TChol 172, TG 81, HDL 36, LDL 120... rec- improve diet efforts, or consider statin. ~  FLP 5/11 showed TChol 169, TG 94, HDL 37, LDL 113... continue diet efforts. ~  FLP 9/12 showed TChol 172, TG 52, HDL 43, LDL 118 ~  FLP 10/13 on diet alone showed TChol 198, TG 99, HDL 47, LDL 131... Offered Crestor5mg /d, he will decide. ~  FLP 4/15 on diet alone showed TChol 174, TG 55, HDL 43, LDL 120... He prefers diet/ exercise & declined low dose statin rx. ~  FLP 7/16 on diet alone showed TChol 154, TG 79, HDL 41, LDL 97  GERD (ICD-530.81) - on NEXIUM 40mg /d.Marland KitchenMarland Kitchen  he states he tried off PPI  meds but had to restart due to signif heartburn... ~  9/12:  He reports that insurance co required switch off Nexium onto Protonix but the latter isn't working w/ return of severe reflux symptoms & he wants to restart the [place] 40mg /d... ~  10/13:  Doing satis back on the Nexium, but trouble w/ coverage thru his insurance... ~  4/15:  He continues stable on the Nexium40...  COLONIC POLYPS (ICD-211.3) & Family Hx of ADENOCARCINOMA, COLON (ICD-153.9)  - colonoscopy 7/01 by DrPatterson showed diminutive rectal polyp= hyperplastic... f/u planned 69yrs but pt cancelled and never rescheduled the procedure... f/u colonoscopy done 1/10 showed divertics & tiny polyp w/o adenomatous change (+FamHx of colon cancer in brother who died of the disease at age 32)... Repeat due 1/15. ~  4/15:  He knows that he is due for f/u colonoscopy & will get this from a gastroenterologist in [facility] => he tells me this was done, we do not have record.  ADENOCARCINOMA, PROSTATE (ICD-185) >> ~  5/11:  when seen 12/09 his routine PSA was 5.98 & he was referred to [place] [facility] in [facility] he lives for eval> +prostate cancer and s/p robotic prostatectomy 5/10 & doing well so far w/ PSA <0.01 on last check 11/10 w/ f/u due soon (Q70mo). ~  He had episode of micturition syncope that occurred one night- awoke w/ full bladder & was standing to urinate, then fell w/ transient syncope, injured tooth & fx rib; recovered uneventfully & he is asked to sit down to empty bladder, stand slowly & be careful> no recurrence. ~  9/12:  He tells me that [place] "released me" & he wants Fred [name] to do yearly PSA f/u here; PSA today = zero... ~  10/13:  Clinically stable w/o voiding difficulty, incont, etc; PSA= 0.00 ~  4/15:  He remains clinically stable w/o complaints and f/u PSA remains zero... ~  7/16:  PSA remains 0.00, he is asking about Cialis for daily use => given Rx.  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  ANXIETY (ICD-300.00)  Health Maintenance >> ~  GI:  His brother died age 91 w/ colon cancer; pt had hyperplastic polyp in past; last colonoscopy was 1/10 by DrPatterson- neg, f/u planned 45yrs. ~  GU:  Hx prostate cancer Dx 2010 w/ Bx DrChapman in [place]; s/p robotic surg 5/10 & doing well since then; PSA= zero ~  Immuniz:  He had PNEUMOVAX in 1999, and gets yearly flu shots... Pneumovax f/u vaccination given 12/09... Given TDAP 5/11   Past Surgical History  Procedure Laterality Date  . Other surgical history      S/P VATS w/ infected bleb removed from RUL 1997  . Cholecystectomy  2004  . Prostatectomy  11/2008    S/P robotic prostatectomy in [facility]    Outpatient Encounter Prescriptions as of 02/17/2015  Medication Sig  . aspirin 325 MG tablet Take 325 mg by mouth daily.    Marland Kitchen esomeprazole (NEXIUM) 40 MG capsule TAKE 1 CAPSULE (40 MG TOTAL) BY MOUTH DAILY BEFORE BREAKFAST.  Marland Kitchen Fluticasone-Salmeterol (ADVAIR DISKUS) 250-50 MCG/DOSE AEPB USE 1 INHALATION ORALLY    TWICE DAILY  . Multiple Vitamin (MULTIVITAMIN) tablet Take 1 tablet by mouth daily.    . [DISCONTINUED] ADVAIR DISKUS 250-50 MCG/DOSE AEPB USE 1 INHALATION ORALLY    TWICE DAILY  . amoxicillin-clavulanate (AUGMENTIN) 875-125 MG per tablet Take 1 tablet by mouth 2 (two)  times daily. (Patient not taking: Reported on 02/17/2015)  . azelastine (ASTELIN) 137 MCG/SPRAY nasal spray 1-2 sprays in each nostril BID as needed for drainage (Patient not taking: Reported on 02/17/2015)  . predniSONE (STERAPRED UNI-PAK) 5 MG TABS tablet Take 1 tablet (5 mg total) by mouth as directed. (Patient not taking: Reported on 02/17/2015)  . tadalafil (CIALIS) 5 MG tablet Take 1 tablet (5 mg total) by mouth daily.  Marland Kitchen tiotropium (SPIRIVA) 18 MCG inhalation capsule Place 1 capsule (18 mcg total) into inhaler and inhale daily.  . [DISCONTINUED] tiotropium (SPIRIVA) 18 MCG inhalation capsule Place 1 capsule (18 mcg total) into inhaler and inhale daily.  (Patient not taking: Reported on 02/17/2015)   No facility-administered encounter medications on file as of 02/17/2015.    No Known Allergies   Current Medications, Allergies, Past Medical History, Past Surgical History, Family History, and Social History were reviewed in [employer] record.     Review of Systems        The patient complains of dyspnea on exertion and cough.  The patient denies fever, chills, sweats, anorexia, fatigue, weakness, malaise, weight loss, sleep disorder, blurring, diplopia, eye irritation, eye discharge, vision loss, eye pain, photophobia, earache, ear discharge, tinnitus, decreased hearing, nasal congestion, nosebleeds, sore throat, hoarseness, chest pain, palpitations, syncope, orthopnea, PND, peripheral edema, dyspnea at rest, excessive sputum, hemoptysis, wheezing, pleurisy, nausea, vomiting, diarrhea, constipation, change in bowel habits, abdominal pain, melena, hematochezia, jaundice, gas/bloating, indigestion/heartburn, dysphagia, odynophagia, dysuria, hematuria, urinary frequency, urinary hesitancy, nocturia, incontinence, back pain, joint pain, joint swelling, muscle cramps, muscle weakness, stiffness, arthritis, sciatica, restless legs, leg pain at night, leg pain with exertion, rash, itching, dryness, suspicious lesions, paralysis, paresthesias, seizures, tremors, vertigo, transient blindness, frequent falls, frequent headaches, difficulty walking, depression, anxiety, memory loss, confusion, cold intolerance, heat intolerance, polydipsia, polyphagia, polyuria, unusual weight change, abnormal bruising, bleeding, enlarged lymph nodes, urticaria, allergic rash, hay fever, and recurrent infections.     Objective:   Physical Exam    WD, WN, 59 y/o WM in NAD... GENERAL:  Alert & oriented; pleasant & cooperative... HEENT:  [place]/AT, EOM-wnl, PERRLA, [place]i-benign, EACs-clear, TMs-wnl, NOSE-clear, THROAT-clear & wnl. NECK:  Supple w/ fairROM; no  JVD; normal carotid impulses w/o bruits; no thyromegaly or nodules palpated; no lymphadenopathy. CHEST:  decr BS bilat, clear to P & A; without wheezes/ rales/ or rhonchi heard... HEART:  Regular Rhythm; without murmurs/ rubs/ or gallops. ABDOMEN:  Soft & nontender; normal bowel sounds; no organomegaly or masses detected. EXT: without deformities or arthritic changes; no varicose veins/ venous insuffic/ or edema. NEURO:  CN's intact; motor testing normal; sensory testing normal; gait normal & balance OK. DERM:  No lesions noted; no rash etc...  RADIOLOGY DATA:  Reviewed in the EPIC EMR & discussed w/ the patient...  LABORATORY DATA:  Reviewed in the EPIC EMR & discussed w/ the patient...   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 Continue [place], Mucinex as needed, etc...  Hx AtypCP>  No further chest discomfort; EKG w/ NSR, IVCD, NAD...  Borderline CHOL>  LDL not at goal but he is not inclined to take statin Rx; therefore diet alone...  GERD>  He has signif symptoms and NOT improved w/ Protonix; NEXIUM is the only PPI that works for him & we will re-write this  med...  Colon Polyps>  As noted brother died age 2 w/ colon cancer; last colonoscopy 1/10 was ok & f/u due now- he will get this in [facility]...  PROSTATE CANCER> DrChapman in [place] has released him & asked that we do yearly PSA; todays lab w/ PSA= zero... NOTE: hx one episode of micturition syncope w/o recurrence...  DJD>  Stable, uses OTC analgesics as needed...  Anxiety>  Aware, stable, no meds required...   Patient's Medications  New Prescriptions   TADALAFIL (CIALIS) 5 MG TABLET    Take 1 tablet (5 mg total) by mouth daily.  Previous Medications   AMOXICILLIN-CLAVULANATE (AUGMENTIN) 875-125 MG PER TABLET    Take 1 tablet by mouth 2 (two)  times daily.   ASPIRIN 325 MG TABLET    Take 325 mg by mouth daily.     AZELASTINE (ASTELIN) 137 MCG/SPRAY NASAL SPRAY    1-2 sprays in each nostril BID as needed for drainage   ESOMEPRAZOLE (NEXIUM) 40 MG CAPSULE    TAKE 1 CAPSULE (40 MG TOTAL) BY MOUTH DAILY BEFORE BREAKFAST.   MULTIPLE VITAMIN (MULTIVITAMIN) TABLET    Take 1 tablet by mouth daily.     PREDNISONE (STERAPRED UNI-PAK) 5 MG TABS TABLET    Take 1 tablet (5 mg total) by mouth as directed.  Modified Medications   Modified Medication Previous Medication   FLUTICASONE-SALMETEROL (ADVAIR DISKUS) 250-50 MCG/DOSE AEPB ADVAIR DISKUS 250-50 MCG/DOSE AEPB      USE 1 INHALATION ORALLY    TWICE DAILY    USE 1 INHALATION ORALLY    TWICE DAILY   TIOTROPIUM (SPIRIVA) 18 MCG INHALATION CAPSULE tiotropium (SPIRIVA) 18 MCG inhalation capsule      Place 1 capsule (18 mcg total) into inhaler and inhale daily.    Place 1 capsule (18 mcg total) into inhaler and inhale daily.  Discontinued Medications   No medications on file

## 2015-02-20 ENCOUNTER — Telehealth: Payer: Self-pay | Admitting: Pulmonary Disease

## 2015-02-20 NOTE — Telephone Encounter (Signed)
Patient says that Spiriva and Advair should have went to [facility]; and all other [facility]s [facility].   Patient says that one of the medications needed prior authorization and that it was sent to Dr. Jeannine Kitten nurse.  To Apolonio Schneiders to follow up

## 2015-02-23 MED ORDER — FLUTICASONE-SALMETEROL 250-50 MCG/DOSE IN AEPB: INHALATION_SPRAY | RESPIRATORY_TRACT | Status: DC

## 2015-02-23 NOTE — Telephone Encounter (Signed)
Called and spoke to pt. Pt stated he wants [facility] sent [facility]. Rx sent. Pt also stated the Cialis is needing a PA.   SN please advise if you are wanting to change medication or do PA for Cialis. Thanks.   No Known Allergies  Current Outpatient Prescriptions on File Prior to Visit  Medication Sig Dispense Refill  . amoxicillin-clavulanate (AUGMENTIN) 875-125 MG per tablet Take 1 tablet by mouth 2 (two) times daily. (Patient not taking: Reported on 02/17/2015) 14 tablet 0  . aspirin 325 MG tablet Take 325 mg by mouth daily.      Marland Kitchen azelastine (ASTELIN) 137 MCG/SPRAY nasal spray 1-2 sprays in each nostril BID as needed for drainage (Patient not taking: Reported on 02/17/2015) 30 mL 12  . esomeprazole (NEXIUM) 40 MG capsule TAKE 1 CAPSULE (40 MG TOTAL) BY MOUTH DAILY BEFORE BREAKFAST. 90 capsule 0  . Multiple Vitamin (MULTIVITAMIN) tablet Take 1 tablet by mouth daily.      . predniSONE (STERAPRED UNI-PAK) 5 MG TABS tablet Take 1 tablet (5 mg total) by mouth as directed. (Patient not taking: Reported on 02/17/2015) 1 each 0  . tadalafil (CIALIS) 5 MG tablet Take 1 tablet (5 mg total) by mouth daily. 30 tablet 2  . tiotropium (SPIRIVA) 18 MCG inhalation capsule Place 1 capsule (18 mcg total) into inhaler and inhale daily. 90 capsule 3   No current facility-administered medications on file prior to visit.

## 2015-02-23 NOTE — Telephone Encounter (Signed)
Pt calling back again, about med mix-up please advise can be reached @ [phone].Fred [name]

## 2015-02-24 NOTE — Telephone Encounter (Signed)
Received PA for Cialis 5 mg. Called [facility] and initiated PA over the phone. Cialis has been approved for 36 mos. Test claim ran, pharmacy/pt notified. Pt id: [id number] Valid 02/24/15-02/23/18 36 mos

## 2015-02-24 NOTE — Telephone Encounter (Signed)
Called [facility] and req PA form  Awaiting form to be faxed

## 2015-02-24 NOTE — Telephone Encounter (Signed)
Per SN yes do PA for cialis #90 for 90 day supply once daily for BPH.

## 2015-04-08 ENCOUNTER — Other Ambulatory Visit: Payer: Self-pay | Admitting: Pulmonary Disease

## 2015-07-09 ENCOUNTER — Other Ambulatory Visit: Payer: Self-pay | Admitting: Pulmonary Disease

## 2015-07-12 ENCOUNTER — Other Ambulatory Visit: Payer: Self-pay | Admitting: Pulmonary Disease

## 2015-07-27 ENCOUNTER — Telehealth: Payer: Self-pay | Admitting: Pulmonary Disease

## 2015-07-27 MED ORDER — AMOXICILLIN-POT CLAVULANATE 875-125 MG PO TABS: 1.0000 | ORAL_TABLET | Freq: Two times a day (BID) | ORAL | Status: DC

## 2015-07-27 NOTE — Telephone Encounter (Signed)
Suggest treat as sinus infection Offer augmentin 875, # 14, 1 twice daily

## 2015-07-27 NOTE — Telephone Encounter (Signed)
Called spoke with pt. He c/o HA, sinus pressure, prod cough (clear phlem mostly but does get some green up at times), PND, nasal cong x 2 weeks now. No f/v/c/s/n.  He has been taking advil cold/sinus and mucinex DM. SN on vacation. Please advise Dr. Annamaria Boots thanks  No Known Allergies   Current Outpatient Prescriptions on File Prior to Visit  Medication Sig Dispense Refill  . amoxicillin-clavulanate (AUGMENTIN) 875-125 MG per tablet Take 1 tablet by mouth 2 (two) times daily. (Patient not taking: Reported on 02/17/2015) 14 tablet 0  . aspirin 325 MG tablet Take 325 mg by mouth daily.      Marland Kitchen azelastine (ASTELIN) 137 MCG/SPRAY nasal spray 1-2 sprays in each nostril BID as needed for drainage (Patient not taking: Reported on 02/17/2015) 30 mL 12  . Fluticasone-Salmeterol (ADVAIR DISKUS) 250-50 MCG/DOSE AEPB USE 1 INHALATION ORALLY    TWICE DAILY 180 each 3  . Multiple Vitamin (MULTIVITAMIN) tablet Take 1 tablet by mouth daily.      Marland Kitchen NEXIUM 40 MG capsule TAKE 1 CAPSULE (40 MG TOTAL) BY MOUTH DAILY BEFORE BREAKFAST. 90 capsule 1  . predniSONE (STERAPRED UNI-PAK) 5 MG TABS tablet Take 1 tablet (5 mg total) by mouth as directed. (Patient not taking: Reported on 02/17/2015) 1 each 0  . tadalafil (CIALIS) 5 MG tablet Take 1 tablet (5 mg total) by mouth daily. 30 tablet 2  . tiotropium (SPIRIVA) 18 MCG inhalation capsule Place 1 capsule (18 mcg total) into inhaler and inhale daily. 90 capsule 3   No current facility-administered medications on file prior to visit.

## 2015-07-27 NOTE — Telephone Encounter (Signed)
Called spoke with pt. Aware of recs. RX sent in. Nothing further needed 

## 2015-08-17 ENCOUNTER — Ambulatory Visit (INDEPENDENT_AMBULATORY_CARE_PROVIDER_SITE_OTHER): Payer: 59

## 2015-08-17 DIAGNOSIS — Z23 Encounter for immunization: Secondary | ICD-10-CM

## 2015-11-06 ENCOUNTER — Telehealth: Payer: Self-pay | Admitting: Pulmonary Disease

## 2015-11-06 MED ORDER — TADALAFIL 5 MG PO TABS: 5.0000 mg | ORAL_TABLET | Freq: Every day | ORAL | Status: DC

## 2015-11-06 NOTE — Telephone Encounter (Signed)
Spoke with pt, requesting refill on [facility]is [facility].  This has been sent.  Nothing further needed.

## 2015-12-31 ENCOUNTER — Other Ambulatory Visit: Payer: Self-pay | Admitting: Pulmonary Disease

## 2016-01-16 ENCOUNTER — Other Ambulatory Visit: Payer: Self-pay | Admitting: Pulmonary Disease

## 2016-03-21 ENCOUNTER — Telehealth: Payer: Self-pay | Admitting: Pulmonary Disease

## 2016-03-21 MED ORDER — PREDNISONE 10 MG (21) PO TBPK: ORAL_TABLET | ORAL | 0 refills | Status: DC

## 2016-03-21 NOTE — Telephone Encounter (Signed)
Per SN--  Continue mucinex and add nasal saline mist  Call in prednisone 10 mg  6 day pack. This has been sent to the pharmacy and the pt is aware of SN recs.

## 2016-03-21 NOTE — Telephone Encounter (Signed)
LMOMTCB x 1 

## 2016-03-21 NOTE — Telephone Encounter (Signed)
Called spoke with pt. He c/o nasal congestion, chest tightness, prod cough (clear) but does not get much phlem up, wheezing, slight PND. Pt is taking mucinex and cold/sinus OTC. Please advise SN thanks  No Known Allergies   Current Outpatient Prescriptions on File Prior to Visit  Medication Sig Dispense Refill  . amoxicillin-clavulanate (AUGMENTIN) 875-125 MG tablet Take 1 tablet by mouth 2 (two) times daily. 14 tablet 0  . aspirin 325 MG tablet Take 325 mg by mouth daily.      Marland Kitchen azelastine (ASTELIN) 137 MCG/SPRAY nasal spray 1-2 sprays in each nostril BID as needed for drainage (Patient not taking: Reported on 02/17/2015) 30 mL 12  . CIALIS 5 MG tablet Take 1 tablet by mouth  daily 90 tablet 0  . Fluticasone-Salmeterol (ADVAIR DISKUS) 250-50 MCG/DOSE AEPB USE 1 INHALATION ORALLY    TWICE DAILY 180 each 3  . Multiple Vitamin (MULTIVITAMIN) tablet Take 1 tablet by mouth daily.      Marland Kitchen NEXIUM 40 MG capsule TAKE 1 CAPSULE (40 MG TOTAL) BY MOUTH DAILY BEFORE BREAKFAST. 90 capsule 1  . predniSONE (STERAPRED UNI-PAK) 5 MG TABS tablet Take 1 tablet (5 mg total) by mouth as directed. (Patient not taking: Reported on 02/17/2015) 1 each 0  . SPIRIVA HANDIHALER 18 MCG inhalation capsule Inhale the contents of 1  capsule via HandiHaler  daily 90 capsule 1   No current facility-administered medications on file prior to visit.

## 2016-03-21 NOTE — Telephone Encounter (Signed)
Pt calling back [phone]

## 2016-03-28 ENCOUNTER — Telehealth: Payer: Self-pay | Admitting: Pulmonary Disease

## 2016-03-28 NOTE — Telephone Encounter (Signed)
Per SN---  Sounds like he needs to be seen.  Pt currently lives in [place].  Per SN---go to [facility], new PCP or he can come here tomorrow to be seen by SN.  If he wants we can called in pred dosepak  5 mg  12 day pack to take as directed.  I called and advised pt of SN recs.  He stated that he will call to see if he can be seen tomorrow there, if not he will call and let me know and we will make arrangements.

## 2016-03-28 NOTE — Telephone Encounter (Signed)
Spoke with pt who c/o non prod cough , chest tightness & sob, denies any fever,chills or sweats. Pt was called in prednisone taper on 03/21/16. Pt states symptoms subsided for the first 2 days of prednisone but has since returned. Pt finished taper on 03/27/16. Pt states he has been using mucinex with no improvement.  SN please advise.   Current Outpatient Prescriptions on File Prior to Visit  Medication Sig Dispense Refill  . amoxicillin-clavulanate (AUGMENTIN) 875-125 MG tablet Take 1 tablet by mouth 2 (two) times daily. 14 tablet 0  . aspirin 325 MG tablet Take 325 mg by mouth daily.      Marland Kitchen azelastine (ASTELIN) 137 MCG/SPRAY nasal spray 1-2 sprays in each nostril BID as needed for drainage (Patient not taking: Reported on 02/17/2015) 30 mL 12  . CIALIS 5 MG tablet Take 1 tablet by mouth  daily 90 tablet 0  . Fluticasone-Salmeterol (ADVAIR DISKUS) 250-50 MCG/DOSE AEPB USE 1 INHALATION ORALLY    TWICE DAILY 180 each 3  . Multiple Vitamin (MULTIVITAMIN) tablet Take 1 tablet by mouth daily.      Marland Kitchen NEXIUM 40 MG capsule TAKE 1 CAPSULE (40 MG TOTAL) BY MOUTH DAILY BEFORE BREAKFAST. 90 capsule 1  . predniSONE (STERAPRED UNI-PAK 21 TAB) 10 MG (21) TBPK tablet Take as directed 21 tablet 0  . predniSONE (STERAPRED UNI-PAK) 5 MG TABS tablet Take 1 tablet (5 mg total) by mouth as directed. (Patient not taking: Reported on 02/17/2015) 1 each 0  . SPIRIVA HANDIHALER 18 MCG inhalation capsule Inhale the contents of 1  capsule via HandiHaler  daily 90 capsule 1   No current facility-administered medications on file prior to visit.

## 2016-04-06 ENCOUNTER — Other Ambulatory Visit: Payer: Self-pay | Admitting: Pulmonary Disease

## 2016-04-08 ENCOUNTER — Other Ambulatory Visit: Payer: Self-pay | Admitting: Pulmonary Disease

## 2016-04-15 ENCOUNTER — Other Ambulatory Visit: Payer: Self-pay | Admitting: Pulmonary Disease

## 2016-06-27 ENCOUNTER — Other Ambulatory Visit: Payer: Self-pay | Admitting: Pulmonary Disease

## 2016-08-20 ENCOUNTER — Other Ambulatory Visit: Payer: Self-pay | Admitting: Pulmonary Disease

## 2016-09-25 ENCOUNTER — Ambulatory Visit (INDEPENDENT_AMBULATORY_CARE_PROVIDER_SITE_OTHER): Payer: 59 | Admitting: Pulmonary Disease

## 2016-09-25 ENCOUNTER — Other Ambulatory Visit (INDEPENDENT_AMBULATORY_CARE_PROVIDER_SITE_OTHER): Payer: 59

## 2016-09-25 ENCOUNTER — Ambulatory Visit (INDEPENDENT_AMBULATORY_CARE_PROVIDER_SITE_OTHER)
Admission: RE | Admit: 2016-09-25 | Discharge: 2016-09-25 | Disposition: A | Discharge status: Home / Self Care | Payer: 59 | Source: Ambulatory Visit | Attending: Pulmonary Disease | Admitting: Pulmonary Disease

## 2016-09-25 VITALS — BP 120/70 | HR 70 | Temp 97.3°F | Ht 72.0 in | Wt 184.1 lb

## 2016-09-25 DIAGNOSIS — F411 Generalized anxiety disorder: Secondary | ICD-10-CM

## 2016-09-25 DIAGNOSIS — K219 Gastro-esophageal reflux disease without esophagitis: Secondary | ICD-10-CM

## 2016-09-25 DIAGNOSIS — M8949 Other hypertrophic osteoarthropathy, multiple sites: Secondary | ICD-10-CM

## 2016-09-25 DIAGNOSIS — J439 Emphysema, unspecified: Secondary | ICD-10-CM

## 2016-09-25 DIAGNOSIS — M159 Polyosteoarthritis, unspecified: Secondary | ICD-10-CM

## 2016-09-25 DIAGNOSIS — J432 Centrilobular emphysema: Secondary | ICD-10-CM | POA: Diagnosis not present

## 2016-09-25 DIAGNOSIS — F17290 Nicotine dependence, other tobacco product, uncomplicated: Secondary | ICD-10-CM

## 2016-09-25 DIAGNOSIS — D126 Benign neoplasm of colon, unspecified: Secondary | ICD-10-CM

## 2016-09-25 DIAGNOSIS — E7800 Pure hypercholesterolemia, unspecified: Secondary | ICD-10-CM

## 2016-09-25 DIAGNOSIS — C61 Malignant neoplasm of prostate: Secondary | ICD-10-CM

## 2016-09-25 DIAGNOSIS — M150 Primary generalized (osteo)arthritis: Secondary | ICD-10-CM

## 2016-09-25 LAB — COMPREHENSIVE METABOLIC PANEL WITH GFR
ALT: 16 U/L (ref 0–53)
AST: 18 U/L (ref 0–37)
Albumin: 4.4 g/dL (ref 3.5–5.2)
Alkaline Phosphatase: 91 U/L (ref 39–117)
BUN: 12 mg/dL (ref 6–23)
CO2: 29 meq/L (ref 19–32)
Calcium: 9.4 mg/dL (ref 8.4–10.5)
Chloride: 107 meq/L (ref 96–112)
Creatinine, Ser: 0.73 mg/dL (ref 0.40–1.50)
GFR: 116.62 mL/min
Glucose, Bld: 84 mg/dL (ref 70–99)
Potassium: 4.4 meq/L (ref 3.5–5.1)
Sodium: 140 meq/L (ref 135–145)
Total Bilirubin: 0.6 mg/dL (ref 0.2–1.2)
Total Protein: 7.1 g/dL (ref 6.0–8.3)

## 2016-09-25 LAB — CBC WITH DIFFERENTIAL/PLATELET
Basophils Absolute: 0 10*3/uL (ref 0.0–0.1)
Basophils Relative: 0.5 % (ref 0.0–3.0)
Eosinophils Absolute: 0.1 10*3/uL (ref 0.0–0.7)
Eosinophils Relative: 1.3 % (ref 0.0–5.0)
HCT: 43.8 % (ref 39.0–52.0)
Hemoglobin: 14.7 g/dL (ref 13.0–17.0)
Lymphocytes Relative: 25.6 % (ref 12.0–46.0)
Lymphs Abs: 1.8 10*3/uL (ref 0.7–4.0)
MCHC: 33.5 g/dL (ref 30.0–36.0)
MCV: 86.7 fl (ref 78.0–100.0)
Monocytes Absolute: 0.6 10*3/uL (ref 0.1–1.0)
Monocytes Relative: 8.1 % (ref 3.0–12.0)
Neutro Abs: 4.6 10*3/uL (ref 1.4–7.7)
Neutrophils Relative %: 64.5 % (ref 43.0–77.0)
Platelets: 333 10*3/uL (ref 150.0–400.0)
RBC: 5.06 Mil/uL (ref 4.22–5.81)
RDW: 14.1 % (ref 11.5–15.5)
WBC: 7.2 10*3/uL (ref 4.0–10.5)

## 2016-09-25 LAB — LIPID PANEL
CHOL/HDL RATIO: 4
Cholesterol: 164 mg/dL (ref 0–200)
HDL: 41.6 mg/dL (ref >39.00)
LDL Cholesterol: 108 mg/dL — ABNORMAL HIGH (ref 0–99)
NonHDL: 122.28
Triglycerides: 71 mg/dL (ref 0.0–149.0)
VLDL: 14.2 mg/dL (ref 0.0–40.0)

## 2016-09-25 LAB — PSA: PSA: 0 ng/mL — ABNORMAL LOW (ref 0.10–4.00)

## 2016-09-25 LAB — TSH: TSH: 1.27 u[IU]/mL (ref 0.35–4.50)

## 2016-09-25 MED ORDER — TIOTROPIUM BROMIDE MONOHYDRATE 18 MCG IN CAPS: ORAL_CAPSULE | RESPIRATORY_TRACT | 3 refills | Status: DC

## 2016-09-25 MED ORDER — FLUTICASONE-SALMETEROL 250-50 MCG/DOSE IN AEPB: INHALATION_SPRAY | RESPIRATORY_TRACT | 3 refills | Status: DC

## 2016-09-25 MED ORDER — AZITHROMYCIN 250 MG PO TABS: ORAL_TABLET | ORAL | 3 refills | Status: DC

## 2016-09-25 NOTE — Patient Instructions (Signed)
Today we updated your med list in our EPIC system...    Continue your current medications the same...    We refilled your meds per request...  We also wrote for a ZPak to use as needed for infection (keep this on hand)...  Today we checked a follow up CXR, EKG, & FASTING blood work...    We will contact you w/ the results when available...   We reviewed the importance of >>    1) SMOKING CESSATION    2) increased exercise program  Call for any questions or if we can be of service in any way...  Let's plan a follow up visit in 71yr, sooner if needed for problems.Marland KitchenMarland Kitchen

## 2016-09-26 ENCOUNTER — Other Ambulatory Visit: Payer: Self-pay | Admitting: Pulmonary Disease

## 2016-09-27 ENCOUNTER — Telehealth: Payer: Self-pay | Admitting: Pulmonary Disease

## 2016-09-27 ENCOUNTER — Encounter: Payer: Self-pay | Admitting: Pulmonary Disease

## 2016-09-27 DIAGNOSIS — R9389 Abnormal findings on diagnostic imaging of other specified body structures: Secondary | ICD-10-CM

## 2016-09-27 NOTE — Progress Notes (Addendum)
Subjective:    Patient ID: Fred [name], male    DOB: 08-06-1955, 61 y.o.   MRN: [id number]  HPI 61 y/o WM here for a follow up visit... he has multiple medical problems as noted below...  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 ~  SEE PREV EPIC NOTES FOR OLDER DATA >>   ~  May 01, 2102:  Black CPX> Fred [name] has had a good year, no new complaints or concerns except his insurance co difficulty w/ Nexium40mg /d & he will check w/ [name] & 30d vs 90d via his [employer]...    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 CXR is unchanged...    Chol> on diet alone; he had recent business trip & is worried his diet wasn't what it should have been; FLP showed TChol 198, TG 99, HDL 47, LDL 131    GERD> on Nexium40; Protonix wasn't helpful & he is hopeful that his insurance co will cover the needed Nexium...    Colon Polyp & +FamHx colon ca> last colon September 06, 2022 w/ divertics & tiny polyp, f/u due 1/15 and he remains asymptomatic...    Prostate cancer> s/p robotic prostatectomy 5/10 & DrChapman in [place] released him 9/12 & asked [name] to monitor PSA yearly; Labs today showed PSA= 0.00 We reviewed prob list, meds, xrays and labs> see below for updates >> OK 2013 Flu vaccine today...  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  LABS 10/13:  FLP- ok x LDL=131 on diet alone;  Chems- wnl;  CBC- wnl;  TSH=0.79;  PSA=0.00;  Urine- clear...  ~  November 03, 2013:  40mo ROV & Fred [name] reports a good interval w/o new complaints or concerns; getting regular exercise (retired) and feeling well- he notes some troublesome sinus  drainage and recent "cold" for which we called in a Pred dosepak but he notes min change; we discussed Rx w/ OTC Antihist, Nasal saline mist Q1-2h, Mucinex- 2Bid, Fluticasone 2sp each nostril Qhs, and Rx for Astelin- 2sp each nostril Bid as needed; he still smokes a pipe regularly & again asked to quit! Explained that he might need ENT eval in [facility] if symptoms don't abate... We reviewed the following medical problems during today's office visit >>     COPD, Emphysematous bleb, s/p RUL bullectomy 1997 for infected bleb w/ A/F level, pipe smoker> on Advair250, Spiriva; no intercurrent infections, min cough & sput, no hemoptysis, stable DOE w/o change, no edema, etc; he reports cut back on pipe smoking to ~50% of former level & asked to decr further & work on smoking cessation! CXR is stable, unchanged...    Chol> on diet alone & his weight is good; FLP 4/15 showed TChol 174, TG 55, HDL 43, LDL 120; he does not want meds despite not being optimal, reviewed diet...    GERD> on Nexium40; Protonix wasn't helpful; he denies adb pain, dysphagia, n/v, c/d, blood seen...     Colon Polyp & +FamHx colon ca (bro died at 52)> last colon September 06, 2022 w/ divertics & tiny polyp, f/u due 1/15 and he remains asymptomatic (he will get this  in [facility])...    Prostate cancer> s/p robotic prostatectomy 5/10 & DrChapman in [place] released him 9/12 & asked [name] to monitor PSA yearly; Labs today showed PSA= 0.00 We reviewed prob list, meds, xrays and labs> see below for updates >> he did not get the 2014 Flu vaccine last fall & reminded to get this every yr at local pharm...  CXR 4/15 showed norm heart size, vol loss on right w/ pleuroparenchymal scarring, hyperinflation on left w/ bullous emphysema, stable- NAD...  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;  He reports breathing is doing OK & he had one infectious exac 2/16 (we called in meds); unfortunately he is still smoking a pipe "I enjoy it" but less tobacco than before he says; in addition he has been using his Spiriva "prn" as he freq forgets this dose-- we had a productive conversation about his bullous emphysema/ prev surg/ etc, I indicated that while I am delighted that he remains relatively asymptomatic at this time, that he is more rapidly approaching a life of deteriorating resp function w/ daily symptoms/ chronic resp failure/ need for oxygen 24/7, etc; he understands this & I have made yet another pitch for complete smoking cessation & taking his meds regularly day-in and day-out...     COPD, Emphysematous bleb, s/p RUL bullectomy 1997 for infected bleb w/ A/F level, pipe smoker> on Advair250Bid, Spiriva daily; no intercurrent infections, min cough & sput, no hemoptysis, stable DOE w/o change, no edema, etc; he reports cut back on pipe smoking but must quit completely as above; CXR is stable, unchanged w/ chr changes on right & bullous dis on left...    Chol> on diet alone & his weight is good; FLP 7/16 showed TChol 154, TG 79, HDL 41, LDL 97 => improved, keep up the good work!    GERD> on Nexium40; Protonix wasn't helpful; he denies abd pain, dysphagia, n/v, c/d, blood seen; he tried off the Nexium- symptoms returned therefore back on the PPI daily...    Colon Polyp & +FamHx colon ca (bro died at 52)> last colon 119-Jan-2024w/ divertics & tiny polyp, f/u done in [place] in 2015, he reports neg & [place]    Prostate cancer> s/p robotic [place] 5[place]10 [place]; Labs today showed PSA= 0.00, requests Cialis for daily use. We reviewed prob list, meds, xrays and labs> see below for updates >> Given PREVNAR-13 today...  CXR  02/17/15 showed norm heart size, severe COPD w/ bullous changes on left & s/p surg/ scarring on right, NAD/ no change from 2015...  Spirometry 02/17/15 showed FVC=3.25 (64%), FEV1=1.82 (46%), %1sec=56, mid-flows reduced at 21% predicted; c/w mod airflow obstruction & GOLD Stage3 COPD (can't r/o superimposed restriction w/o LVs)...  LABS 02/17/15:  FLP- all parameters at goals on diet;  Chems- wnl;  CBC- wnl;  TSH=1.10;  PSA= 0.00    IMP/PLAN>>  PFT shows functional deterioration from last studies, now GOLD Stage3, CXR unchanged, Labs OK;  He knows the score & must quit all smoking, take meds every day, try  to avoid infections, increase exercise program..    ~  September 25, 2016:  54mo ROV & CPX>  Fred [name] reports doing well & only recalls one resp exac since last visit- records in Epic/ CareEverywhere reviewed>>    He called here 03/21/16 c/o nasal congestion, chest tight, cough w/ clear mucus;  We called in Pred dosepak, Mucinex, nasal saline...    He called back 8/31 c/o persistent cough (now nonproductive) chest tight/ SOB, denied f/c/s;  Asked to go to PCP vs UC for exam & to be checked...    He saw PCP 8/31 at [facility], [place], [facility]> c/o cough & cold-like sx for 1wk, T101.5, CXR showed LUL pneumonia, given IM-Rocephin, Levaquin500 x10d, Tussionex...    He was f/u PCP-Christensen on 9/5> c/o persist fever/ cough/ congestion but feeling sl better; Temp 100.8 &he was given Rocephin IM, continue Levaquin...    He was rechecked 9/7> clinically improved, T98.9, given 10 more days of Levaquin500...    Pt returned 10/13> c/o 1wk hx recurrent cough, sputum, congestion & Dx w/ bronchitis=> given another 10d Levaquin500, no f/u CXR done. Currently he notes a mild "cold" w/ min cough, no sput, no hemoptysis, no f/c/s, & stable SOB/DOE esp if going uphill, pushing wheelbarrow, etc... 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 we reviewed the following medical problems during today's office visit >>     COPD, Emphysematous bleb, s/p RUL bullectomy 1997 for infected bleb w/ A/F level, pipe smoker> on Advair250Bid, Spiriva daily; min cough & sput, no hemoptysis, stable DOE w/o change, no edema, etc; he reports cut back on pipe smoking but must quit completely as above; CXR was stable x yrs w/ chr changes on right & bullous dis on left; then f/u CXR 09/25/16 showed new LUL lesion => CT pending.    New LUL peripheral lesion/ CXR abnormality> found on routine CXR 09/25/16 => work-up in progress w/ CT Chest pending...    Chol> on diet alone & his weight is good; FLP 2/18 showed TChol 164, TG 71, HDL 42, LDL 108 => stable & doing satis on diet...    GERD> on Nexium40; Protonix wasn't helpful; he denies abd pain, dysphagia, n/v, c/d, blood seen; he tried off the Nexium- symptoms returned therefore back on the PPI daily...    Colon Polyp & +FamHx colon ca (bro died at 52)> colon 08-21-2022 w/ divertics & tiny polyp, f/u done in [place] in 2015, he reports neg & will get copies sent to [name]     Prostate cancer> s/p robotic prostatectomy 5/10 & DrChapman in [place] released him 9/12 & asked [name] to monitor PSA yearly; Labs today showed PSA= 0.00, requests Cialis for daily use. EXAM shows Afeb, VSS, O2sat=99% on RA;  HEENT- neg, mallampati1;  Chest- decr BS but clear w/o w/r/r;  Heart- RR gr1/6 SEM w/o r/g;  Abd- soft, nontender, neg;  Ext- neg w/o c/c/e;  Neuro- intact w/o focal abn...  CXR 09/25/16>  Norm heart size, new pleuroparenchymal thickening & nodular density in left apex measuring about 2cm, increased interstitial markings bilat, emphysema & bullous changes bilat...  EKG 09/25/16>  NSR, rate62, IVCD, NAD...  LABS 09/25/16>  FLP- ok w/ LDL=108 on diet alone;  Chems- wnl;  CBC- wnl;  TSH=1.27;  PSA=0.00    IMP/PLAN>>  Fred [name] has a new abn on his f/u CXR in the left apex=> needs CT Chest ASAP for further  eval & we will  sched;  In the meanwhile we (again) reviewed the need to quit all smoking (he still smokes a pipe) and take meds regularly- Advair250Bid & Spiriva daily;  He needs to incr his exercise in a gradual exercise program as well;  We gave him the 2017 FLU vaccine today & a prescription for a ZPak to keep on hand.  ADDENDUM>>  CT CHEST w/ contrast 10/02/16 showed norm heart size, atherosclerosis Ao & L-Circ, norm caliber PAs & no PE seen; mod centrilob & paraseptal emhysema w/ bullous changes bilat & s/p RUL wedge resection w/ post surg scarring; thick walled irreg 6.5 x 3.7 cm cavitary lesion in LUL apical-post;  2 small nodules 4-70mm size in lingula and RML noted; two 1.0-1.2cm nodes (L mediastinal & R hilar); s/pGB & 2cm R-renal cyst... NOTE: we discussed CT results and options=> decided to proceed w/ PET scan...  ADDENDUM>>  PET scan 10/23/16 showed a cavitary left apical process w/ nodularity along its inferior border tracking along the pleural surface (similar appearance to the recent CT Chest)- max assoc SUV is along this inferior pleural nodularity w/ max SUV=5.8;  Underlying emphysema, calcif biapical scarring R>L;  approx 6 x 2 cm periph density LLL posteromedially w/ max SUV=2.2 & likely benign;  Right hilar adenopathy is faintly hypermet w/ max SUV=3.0;  Incidental focal metabolic actiivity along the cecum w/ SUV max=9.7 (tumor cannot be excluded, no adenopathy apprec in this area)=> we will refer to his GI...  I reviewed these scans w/ Radiology & their IR team=> they felt that needle bx LUL could be done but carried a signif risk of pneumothorax & they suggested watchful waiting w/ repeat CT Chest in 3-4 months;  I called Pt & wife to review all this & they are in agreement w/ conservative approach & repeat scan in [id number] is planned;  They know to call me for any problems, Fred [name] will call his gastroenterologist for OV to discuss PET scan & address ?need for repeat colon at tis time...             Problem List:  ALLERGY (ICD-995.3) - uses OTC antihistamines Prn...  COPD (ICD-496) & EMPHYSEMATOUS BLEB (ICD-492.0) - on [place] daily... unfortunately he still smokes a pipe daily (w/ min cough, drainage, etc)... he has a neg FamHx of lung disease and a normal A1AT level (MM phenotype)... he is s/p VATS- RUL bullectomy in 1997 for infected bulla w/ A/F level. ~  baseline CXR's back to 1990 showed large bilat bullae R>L & scarring at left base... ~  f/u CXR's w/ vol loss on right from the surg, bullae on the left, scarring, etc... ~  prev CT Chest revealed mult blebs & bullae in RUL & LUL to the lingular w/ scarring...  ~  1997= 14cm RUL bulla w/ AF level (required VATS Bullectomy- path= emphysema, infected bulla w/ acute/ chr inflamm). ~  CT Chest 10/09 in [place]= similar bullous dis on left w/ emphysema, post-op changes on right, NAD,etc. ~  PFT 6/00 showed FVC= 4.41 (84%), FEV1= 2.53 (59%), %1sec= 57, mid-flows= 25%pred... ~  PFT 6/06 showed FVC= 4.59 (87%), FEV1= 2.79 (65%), %1sec= 61, mid-flows= 27%pred... ~  CXR 5/11 showed advanced chr lung dis w/ hyperinflation, scarring, decr vol on right> NAD. ~  PFT 9/12 showed FVC= 4.07 (79%), FEV1= 2.34 (57%), %1sec= 57, mid-flows= 24%pred... c/w GOLD Stage2 COPD- MUST QUIT ALL SMOKING! ~  CXR 9/12 showed no signif change  in his bullous lung dis & evid of prev surg on the right... Asked again to quit the pipe. 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 Must quit all smoking! 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 ~  Spirometry 02/17/15 showed FVC=3.25 (64%), FEV1=1.82 (46%), %1sec=56, mid-flows reduced at 21% predicted; c/w mod airflow obstruction & GOLD Stage3 COPD  (can't r/o superimposed restriction w/o LVs)... ~  CXR 09/25/16>  Norm heart size, new pleuroparenchymal thickening & nodular density in left apex measuring about 2cm, increased interstitial markings bilat, emphysema & bullous changes bilat => proceed w/ CT Chest.  ABN CXR w/ LUL peripheral lesion found on routine CXR 09/25/16=> eval in progress...  Hx of CHEST PAIN, ATYPICAL (ICD-786.59) - on ASA 81mg /d... hx intermittent chest wall tightness/ spasm/ ?palpit/ numb in left arm- last 2-3 min and resolves spont, not exercise induced... s/p cardiac eval 2/08 by DrCooper- he wanted to do treadmill and holter but pt never did these and states he is doing fine w/o any incr in symptoms... ~  EKG 5/11 showed NSR 74/min, IVCD, NAD (no ch from old tracings) ~  EKG 9/12 showed NSR 68/min, IVCD, NAD & no change...  HYPERCHOLESTEROLEMIA, MILD (ICD-272.0) - on diet alone... ~  [place] 12/07 showed TChol 182, TG 80, HDL 36, LDL 130... he prefers diet Rx. ~  [place] 12/09 showed TChol 172, TG 81, HDL 36, LDL 120... rec- improve diet efforts, or consider statin. ~  FLP 5/11 showed TChol 169, TG 94, HDL 37, LDL 113... continue diet efforts. ~  FLP 9/12 showed TChol 172, TG 52, HDL 43, LDL 118 ~  FLP 10/13 on diet alone showed TChol 198, TG 99, HDL 47, LDL 131... Offered Crestor5mg /d, he will decide. ~  FLP 4/15 on diet alone showed TChol 174, TG 55, HDL 43, LDL 120... He prefers diet/ exercise & declined low dose statin rx. ~  FLP 7/16 on diet alone showed TChol 154, TG 79, HDL 41, LDL 97 ~  FLP 2/18 on diet alone showed TChol 164, TG 71, HDL 42, LDL 108  GERD (ICD-530.81) - on NEXIUM 40mg /d... he states he tried off PPI meds but had to restart due to signif heartburn... ~  9/12:  He reports that insurance co required switch off Nexium onto Protonix but the latter isn't working w/ return of severe reflux symptoms & he wants to restart the [place] 40mg /d... ~  10/13:  Doing satis back on the Nexium, but trouble w/ coverage  thru his insurance... ~  4/15:  He continues stable on the Nexium40...  COLONIC POLYPS (ICD-211.3) & Family Hx of ADENOCARCINOMA, COLON (ICD-153.9)  - colonoscopy 7/01 by DrPatterson showed diminutive rectal polyp= hyperplastic... f/u planned 72yrs but pt cancelled and never rescheduled the procedure... f/u colonoscopy done 1/10 showed divertics & tiny polyp w/o adenomatous change (+FamHx of colon cancer in brother who died of the disease at age 33)... Repeat due 1/15. ~  4/15:  He knows that he is due for f/u colonoscopy & will get this from a gastroenterologist in [facility] => he tells me this was done, we do not have record.  ADENOCARCINOMA, PROSTATE (ICD-185) >> ~  5/11:  when seen 12/09 his routine PSA  was 5.98 & he was referred to [facility] in [facility] where he lives for eval> +prostate cancer and s/p robotic prostatectomy 5/10 & doing well so far w/ PSA <0.01 on last check 11/10 w/ f/u due soon (Q36mo). ~  He had episode of micturition syncope that occurred one night- awoke w/ full bladder & was standing to urinate, then fell w/ transient syncope, injured tooth & fx rib; recovered uneventfully & he is asked to sit down to empty bladder, stand slowly & be careful> no recurrence. ~  9/12:  He tells me that [place] "released me" & he wants [name] to do yearly PSA f/u here; PSA today = zero... ~  10/13:  Clinically stable w/o voiding difficulty, incont, etc; PSA= 0.00 ~  4/15:  He remains clinically stable w/o complaints and f/u PSA remains zero... ~  7/16:  PSA remains 0.00, he is asking about Cialis for daily use => given Rx.  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  ANXIETY (ICD-300.00)  Health Maintenance >> ~  GI:  His brother died age 90 w/ colon cancer; pt had hyperplastic polyp in past; last colonoscopy was 1/10 by DrPatterson- neg, f/u planned 72yrs. ~  GU:  Hx prostate cancer Dx 2010 w/ Bx DrChapman in [place]; s/p robotic surg 5/10 & doing well  since then; PSA= zero ~  Immuniz:  He had PNEUMOVAX in 1999, and gets yearly flu shots... Pneumovax f/u vaccination given 12/09... Given TDAP 5/11   Past Surgical History:  Procedure Laterality Date  . CHOLECYSTECTOMY  2004  . OTHER SURGICAL HISTORY     S/P VATS w/ infected bleb removed from RUL 1997  . PROSTATECTOMY  11/2008   S/P robotic prostatectomy in [facility]    Outpatient Encounter Prescriptions as of 09/25/2016  Medication Sig Dispense Refill  . aspirin 325 MG tablet Take 325 mg by mouth daily.      Marland Kitchen CIALIS 5 MG tablet Take 1 tablet by mouth  daily 90 tablet 1  . Fluticasone-Salmeterol (ADVAIR DISKUS) 250-50 MCG/DOSE AEPB Use 1 inhalation orally  twice daily 180 each 3  . Multiple Vitamin (MULTIVITAMIN) tablet Take 1 tablet by mouth daily.      Marland Kitchen tiotropium (SPIRIVA HANDIHALER) 18 MCG inhalation capsule INHALE THE CONTENTS OF 1  CAPSULE VIA HANDIHALER  DAILY 90 capsule 3  . [DISCONTINUED] ADVAIR DISKUS 250-50 MCG/DOSE AEPB Use 1 inhalation orally  twice daily 180 each 3  . [DISCONTINUED] NEXIUM 40 MG capsule TAKE 1 CAPSULE (40 MG TOTAL) BY MOUTH DAILY BEFORE BREAKFAST. 90 capsule 1  . [DISCONTINUED] SPIRIVA HANDIHALER 18 MCG inhalation capsule INHALE THE CONTENTS OF 1  CAPSULE VIA HANDIHALER  DAILY 90 capsule 3  . azithromycin (ZITHROMAX) 250 MG tablet Take as directed 6 tablet 3  . [DISCONTINUED] amoxicillin-clavulanate (AUGMENTIN) 875-125 MG tablet Take 1 tablet by mouth 2 (two) times daily. (Patient not taking: Reported on 09/25/2016) 14 tablet 0  . [DISCONTINUED] azelastine (ASTELIN) 137 MCG/SPRAY nasal spray 1-2 sprays in each nostril BID as needed for drainage (Patient not taking: Reported on 02/17/2015) 30 mL 12  . [DISCONTINUED] predniSONE (STERAPRED UNI-PAK 21 TAB) 10 MG (21) TBPK tablet Take as directed (Patient not taking: Reported on 09/25/2016) 21 tablet 0  . [DISCONTINUED] predniSONE (STERAPRED UNI-PAK) 5 MG TABS tablet Take 1 tablet (5 mg total) by mouth as directed. (Patient  not taking: Reported on 02/17/2015) 1 each 0   No facility-administered encounter medications on file as of  09/25/2016.     No Known Allergies   Current Medications, Allergies, Past Medical History, Past Surgical History, Family History, and Social History were reviewed in [employer] record.     Review of Systems        The patient complains of dyspnea on exertion and cough.  The patient denies fever, chills, sweats, anorexia, fatigue, weakness, malaise, weight loss, sleep disorder, blurring, diplopia, eye irritation, eye discharge, vision loss, eye pain, photophobia, earache, ear discharge, tinnitus, decreased hearing, nasal congestion, nosebleeds, sore throat, hoarseness, chest pain, palpitations, syncope, orthopnea, PND, peripheral edema, dyspnea at rest, excessive sputum, hemoptysis, wheezing, pleurisy, nausea, vomiting, diarrhea, constipation, change in bowel habits, abdominal pain, melena, hematochezia, jaundice, gas/bloating, indigestion/heartburn, dysphagia, odynophagia, dysuria, hematuria, urinary frequency, urinary hesitancy, nocturia, incontinence, back pain, joint pain, joint swelling, muscle cramps, muscle weakness, stiffness, arthritis, sciatica, restless legs, leg pain at night, leg pain with exertion, rash, itching, dryness, suspicious lesions, paralysis, paresthesias, seizures, tremors, vertigo, transient blindness, frequent falls, frequent headaches, difficulty walking, depression, anxiety, memory loss, confusion, cold intolerance, heat intolerance, polydipsia, polyphagia, polyuria, unusual weight change, abnormal bruising, bleeding, enlarged lymph nodes, urticaria, allergic rash, hay fever, and recurrent infections.     Objective:   Physical Exam    WD, WN, 61 y/o WM in NAD... GENERAL:  Alert & oriented; pleasant & cooperative... HEENT:  [place]/AT, EOM-wnl, PERRLA, [place]-benign, EACs-clear, TMs-wnl, NOSE-clear, THROAT-clear & wnl. NECK:  Supple w/ fairROM;  no JVD; normal carotid impulses w/o bruits; no thyromegaly or nodules palpated; no lymphadenopathy. CHEST:  decr BS bilat, clear to P & A; without wheezes/ rales/ or rhonchi heard... HEART:  Regular Rhythm; without murmurs/ rubs/ or gallops. ABDOMEN:  Soft & nontender; normal bowel sounds; no organomegaly or masses detected. EXT: without deformities or arthritic changes; no varicose veins/ venous insuffic/ or edema. NEURO:  CN's intact; motor testing normal; sensory testing normal; gait normal & balance OK. DERM:  No lesions noted; no rash etc...  RADIOLOGY DATA:  Reviewed in the EPIC EMR & discussed w/ the patient...  LABORATORY DATA:  Reviewed in the EPIC EMR & discussed w/ the patient...   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 Continue [place], Mucinex as needed, etc...  NEW CXR ABNORMALITY w/ LUL peripheral abnormality > work up in progress => CT Chest, subseq PET scan, & decision to hold on any Bx at this time w/ repeat CT Chest in 3-4 mo.  Hx AtypCP>  No further chest discomfort; EKG w/ NSR, IVCD, NAD...  Borderline CHOL>  LDL not at goal but he is not inclined to take statin Rx; therefore diet alone...  GERD>  He has signif symptoms and NOT improved w/ Protonix; NEXIUM is the only PPI that works for him & we will re-write this med...  Colon Polyps>  As noted brother died age 70 w/ colon cancer; last colonoscopy 1/10 was ok & f/u due now- he will get this in [facility]...  PROSTATE CANCER> DrChapman in [place] has released him & asked that we do yearly PSA; todays lab w/ PSA= zero... NOTE: hx one episode of micturition syncope w/o recurrence...  DJD>  Stable, uses OTC analgesics as needed...  Anxiety>  Aware, stable, no meds required...   Patient's Medications  New Prescriptions   AZITHROMYCIN  (ZITHROMAX) 250 MG TABLET  Take as directed  Previous Medications   ASPIRIN 325 MG TABLET    Take 325 mg by mouth daily.     CIALIS 5 MG TABLET    Take 1 tablet by mouth  daily   MULTIPLE VITAMIN (MULTIVITAMIN) TABLET    Take 1 tablet by mouth daily.    Modified Medications   Modified Medication Previous Medication   FLUTICASONE-SALMETEROL (ADVAIR DISKUS) 250-50 MCG/DOSE AEPB ADVAIR DISKUS 250-50 MCG/DOSE AEPB      Use 1 inhalation orally  twice daily    Use 1 inhalation orally  twice daily   NEXIUM 40 MG CAPSULE NEXIUM 40 MG capsule      TAKE 1 CAPSULE (40 MG TOTAL) BY MOUTH DAILY BEFORE BREAKFAST.    TAKE 1 CAPSULE (40 MG TOTAL) BY MOUTH DAILY BEFORE BREAKFAST.   TIOTROPIUM (SPIRIVA HANDIHALER) 18 MCG INHALATION CAPSULE SPIRIVA HANDIHALER 18 MCG inhalation capsule      INHALE THE CONTENTS OF 1  CAPSULE VIA HANDIHALER  DAILY    INHALE THE CONTENTS OF 1  CAPSULE VIA HANDIHALER  DAILY  Discontinued Medications   AMOXICILLIN-CLAVULANATE (AUGMENTIN) 875-125 MG TABLET    Take 1 tablet by mouth 2 (two) times daily.   AZELASTINE (ASTELIN) 137 MCG/SPRAY NASAL SPRAY    1-2 sprays in each nostril BID as needed for drainage   PREDNISONE (STERAPRED UNI-PAK 21 TAB) 10 MG (21) TBPK TABLET    Take as directed   PREDNISONE (STERAPRED UNI-PAK) 5 MG TABS TABLET    Take 1 tablet (5 mg total) by mouth as directed.

## 2016-09-27 NOTE — Telephone Encounter (Signed)
Per SN: I called patient.  Please place order and schedule a CT Chest w/ contrast (Cr = 0.73 on 09/25/16) to evaluate lung and LUL changes on cxr.  Thanks.  Order placed Will sign off

## 2016-09-30 ENCOUNTER — Telehealth: Payer: Self-pay | Admitting: Pulmonary Disease

## 2016-09-30 NOTE — Telephone Encounter (Signed)
Called and spoke with pt and he is aware to come to the office around 2 pm after his ct scan to speak with SN about these results.

## 2016-10-02 ENCOUNTER — Ambulatory Visit (INDEPENDENT_AMBULATORY_CARE_PROVIDER_SITE_OTHER)
Admission: RE | Admit: 2016-10-02 | Discharge: 2016-10-02 | Disposition: A | Discharge status: Home / Self Care | Payer: 59 | Source: Ambulatory Visit | Attending: Pulmonary Disease | Admitting: Pulmonary Disease

## 2016-10-02 ENCOUNTER — Other Ambulatory Visit: Payer: Self-pay | Admitting: Pulmonary Disease

## 2016-10-02 DIAGNOSIS — R911 Solitary pulmonary nodule: Secondary | ICD-10-CM

## 2016-10-02 DIAGNOSIS — R938 Abnormal findings on diagnostic imaging of other specified body structures: Secondary | ICD-10-CM | POA: Diagnosis not present

## 2016-10-02 DIAGNOSIS — R9389 Abnormal findings on diagnostic imaging of other specified body structures: Secondary | ICD-10-CM

## 2016-10-02 MED ORDER — IOPAMIDOL (ISOVUE-300) INJECTION 61%
80.0000 mL | Freq: Once | INTRAVENOUS | Status: AC | PRN
  Administered 2016-10-02: 80 mL via INTRAVENOUS

## 2016-10-23 ENCOUNTER — Encounter (HOSPITAL_COMMUNITY)
Admission: RE | Admit: 2016-10-23 | Discharge: 2016-10-23 | Disposition: A | Discharge status: Home / Self Care | Payer: 59 | Source: Ambulatory Visit | Attending: Pulmonary Disease | Admitting: Pulmonary Disease

## 2016-10-23 DIAGNOSIS — R911 Solitary pulmonary nodule: Secondary | ICD-10-CM

## 2016-10-23 LAB — GLUCOSE, CAPILLARY: Glucose-Capillary: 88 mg/dL (ref 65–99)

## 2016-10-23 MED ORDER — FLUDEOXYGLUCOSE F - 18 (FDG) INJECTION: 9.0000 | Freq: Once | INTRAVENOUS | Status: DC | PRN

## 2016-10-29 ENCOUNTER — Telehealth: Payer: Self-pay | Admitting: Pulmonary Disease

## 2016-10-29 DIAGNOSIS — J432 Centrilobular emphysema: Secondary | ICD-10-CM

## 2016-10-29 NOTE — Telephone Encounter (Signed)
CT sched for 7/3 at 9:30, pt to arrive at 9:15.  I scheduled pt for 12:00 same day with SN.  I called pt & gave him appt info.

## 2016-10-29 NOTE — Telephone Encounter (Signed)
Per SN -  SN already called pt and will need a CT chest without contrast for July 2018, this will need to be scheduled in the morning and then will need an OV with SN same day as CT but OV will be at 12. Sn would also like the pt's recent PET scan (from 3.28.18) burned onto a disc and mailed to Dr. Rolan Lipa at [address], [place], [place] [address]. Called [facility] and they are mailing the disc out today 10/29/16.   PCC's please advise when CT has been scheduled so OV can be made. Thanks.

## 2016-11-04 ENCOUNTER — Telehealth: Payer: Self-pay | Admitting: Pulmonary Disease

## 2016-11-04 NOTE — Telephone Encounter (Signed)
Spoke with pt, who states nexium 40mg  is not covered by his insurance. Pt states his insurance will cover this medication if SN provides a letter stating this is an necessity. Pt states OTC or genetic do not work well, and gives him indigestion.  I have spoken with Chrissie Noa at [employer] and started PA. Chrissie Noa states letter can also be faxed to [phone] Will route to message to Leigh to f/u up on PA.  PA #[phone]  SN please advise on letter. Thanks.

## 2016-11-05 ENCOUNTER — Encounter: Payer: Self-pay | Admitting: *Deleted

## 2016-11-05 NOTE — Telephone Encounter (Signed)
Letter has been completed per SN.  Pt is aware that this has been faxed to his insurance company and we will updated him accordingly.

## 2016-11-12 ENCOUNTER — Encounter: Payer: Self-pay | Admitting: *Deleted

## 2016-11-12 NOTE — Telephone Encounter (Signed)
Called and spoke with pt and he is aware that the insurance denied his nexium.  Pt stated that he has tried otc meds and omeprazole in the past, but he is willing to try anything.  SN please advise. Thanks  No Known Allergies

## 2016-11-12 NOTE — Telephone Encounter (Signed)
Leigh, have you received any update?

## 2016-11-15 ENCOUNTER — Telehealth: Payer: Self-pay | Admitting: Pulmonary Disease

## 2016-11-15 ENCOUNTER — Encounter: Payer: Self-pay | Admitting: *Deleted

## 2016-11-15 MED ORDER — ESOMEPRAZOLE MAGNESIUM 40 MG PO PACK: 40.0000 mg | PACK | Freq: Every day | ORAL | 4 refills | Status: DC

## 2016-11-15 NOTE — Telephone Encounter (Signed)
Duplicate message.  This has been taken care of.

## 2016-11-15 NOTE — Telephone Encounter (Signed)
Pt sent an email in and stated that his insurance will cover the esomeprazole.  What strength did you want to send in ?  Thanks  No Known Allergies

## 2016-11-15 NOTE — Telephone Encounter (Signed)
Spoke with SN--  Ok to send in the refill for the generic nexium    This has been sent in and nothing further is needed.

## 2016-11-21 MED ORDER — ESOMEPRAZOLE MAGNESIUM 40 MG PO CPDR: 40.0000 mg | DELAYED_RELEASE_CAPSULE | Freq: Every day | ORAL | 3 refills | Status: DC

## 2016-12-02 ENCOUNTER — Telehealth: Payer: Self-pay | Admitting: Pulmonary Disease

## 2016-12-02 MED ORDER — METHYLPREDNISOLONE 4 MG PO TBPK: ORAL_TABLET | ORAL | 0 refills | Status: DC

## 2016-12-02 MED ORDER — CIPROFLOXACIN HCL 500 MG PO TABS: 500.0000 mg | ORAL_TABLET | Freq: Two times a day (BID) | ORAL | 0 refills | Status: DC

## 2016-12-02 NOTE — Telephone Encounter (Signed)
Per SN: Save the zpak- Please call in cipro 500mg  #14, 1 po bid Also please call in medrol dosepack.   ---------- Spoke with pt, aware of recs.  rx's sent to preferred pharmacy.  Nothing further needed.

## 2016-12-02 NOTE — Telephone Encounter (Signed)
Called and spoke to pt. Pt c/o general malaise, non prod cough with little mucus production - clear in color, chest congestion, oral temperature of 99.6 just this morning, other s/s have been going on for 3 days. Pt states he is taking Mucinex 1200mg  BID, his spiriva and Advair as prescribed. Pt denies SOB, CP/tightness. Pt states he has a spare Zpak and was questioning if he should take this or something else SN may prescribe.   SN please advise. Thanks.   No Known Allergies  Current Outpatient Prescriptions on File Prior to Visit  Medication Sig Dispense Refill  . aspirin 325 MG tablet Take 325 mg by mouth daily.      Marland Kitchen azithromycin (ZITHROMAX) 250 MG tablet Take as directed 6 tablet 3  . CIALIS 5 MG tablet Take 1 tablet by mouth  daily 90 tablet 1  . esomeprazole (NEXIUM) 40 MG capsule Take 1 capsule (40 mg total) by mouth daily at 12 noon. 90 capsule 3  . Fluticasone-Salmeterol (ADVAIR DISKUS) 250-50 MCG/DOSE AEPB Use 1 inhalation orally  twice daily 180 each 3  . Multiple Vitamin (MULTIVITAMIN) tablet Take 1 tablet by mouth daily.      Marland Kitchen tiotropium (SPIRIVA HANDIHALER) 18 MCG inhalation capsule INHALE THE CONTENTS OF 1  CAPSULE VIA HANDIHALER  DAILY 90 capsule 3   No current facility-administered medications on file prior to visit.

## 2017-01-27 ENCOUNTER — Telehealth: Payer: Self-pay | Admitting: Pulmonary Disease

## 2017-01-27 NOTE — Telephone Encounter (Signed)
Error.Fred [name] ° °

## 2017-01-28 ENCOUNTER — Ambulatory Visit (INDEPENDENT_AMBULATORY_CARE_PROVIDER_SITE_OTHER)
Admission: RE | Admit: 2017-01-28 | Discharge: 2017-01-28 | Disposition: A | Discharge status: Home / Self Care | Payer: 59 | Source: Ambulatory Visit | Attending: Pulmonary Disease | Admitting: Pulmonary Disease

## 2017-01-28 ENCOUNTER — Encounter: Payer: Self-pay | Admitting: Pulmonary Disease

## 2017-01-28 ENCOUNTER — Ambulatory Visit (INDEPENDENT_AMBULATORY_CARE_PROVIDER_SITE_OTHER): Payer: 59 | Admitting: Pulmonary Disease

## 2017-01-28 VITALS — BP 104/60 | HR 77 | Temp 97.9°F | Ht 72.0 in | Wt 178.1 lb

## 2017-01-28 DIAGNOSIS — J432 Centrilobular emphysema: Secondary | ICD-10-CM

## 2017-01-28 DIAGNOSIS — D126 Benign neoplasm of colon, unspecified: Secondary | ICD-10-CM

## 2017-01-28 DIAGNOSIS — F17290 Nicotine dependence, other tobacco product, uncomplicated: Secondary | ICD-10-CM

## 2017-01-28 DIAGNOSIS — C61 Malignant neoplasm of prostate: Secondary | ICD-10-CM | POA: Diagnosis not present

## 2017-01-28 DIAGNOSIS — M8949 Other hypertrophic osteoarthropathy, multiple sites: Secondary | ICD-10-CM

## 2017-01-28 DIAGNOSIS — F411 Generalized anxiety disorder: Secondary | ICD-10-CM

## 2017-01-28 DIAGNOSIS — M150 Primary generalized (osteo)arthritis: Secondary | ICD-10-CM

## 2017-01-28 DIAGNOSIS — K219 Gastro-esophageal reflux disease without esophagitis: Secondary | ICD-10-CM | POA: Diagnosis not present

## 2017-01-28 DIAGNOSIS — R938 Abnormal findings on diagnostic imaging of other specified body structures: Secondary | ICD-10-CM | POA: Diagnosis not present

## 2017-01-28 DIAGNOSIS — M159 Polyosteoarthritis, unspecified: Secondary | ICD-10-CM

## 2017-01-28 DIAGNOSIS — J439 Emphysema, unspecified: Secondary | ICD-10-CM | POA: Diagnosis not present

## 2017-01-28 DIAGNOSIS — R9389 Abnormal findings on diagnostic imaging of other specified body structures: Secondary | ICD-10-CM

## 2017-01-28 NOTE — Patient Instructions (Signed)
Today we updated your med list in our EPIC system...    Continue your current medications the same...  Today we reviewed your f/u CT Chest scan-- we discussed options including continued careful follow up, poss bronchoscopy w/ lavage for cytology & cultures, poss medical center referral for a second opinion...  As promised- I will discuss your case w/ my colleagues and get back to you with a consensus...  In the meanwhile- continue your current regimen of Advair, Spiriva, & exercise...  Call for any questions or if I can be of service in any way.Marland KitchenMarland Kitchen

## 2017-01-28 NOTE — Progress Notes (Signed)
Subjective:    Patient ID: Fred [name], male    DOB: 12-10-1955, 61 y.o.   MRN: [id number]  HPI 61 y/o WM here for a follow up visit... he has multiple medical problems as noted below...  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 ~  SEE PREV EPIC NOTES FOR OLDER DATA >>   ~  May 01, 2102:  Fred [name]> Fred [name] has had a good year, no new complaints or concerns except his insurance co difficulty w/ Nexium64m/d & he will check w/ [name] & 30d vs 90d via his [employer]...    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 CXR is unchanged...    Chol> on diet alone; he had recent business trip & is worried his diet wasn't what it should have been; FLP showed TChol 198, TG 99, HDL 47, LDL 131    GERD> on Nexium40; Protonix wasn't helpful & he is hopeful that his insurance co will cover the needed Nexium...    Colon Polyp & +FamHx colon ca> last colon 102/03/24w/ divertics & tiny polyp, f/u due 1/15 and he remains asymptomatic...    Prostate cancer> s/p robotic [place] 5/10 [place]; Labs today showed PSA= 0.00 We reviewed prob list, meds, xrays and labs> see below for updates >> OK 2013 Flu vaccine today...  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  LABS 10/13:  FLP- ok x LDL=131 on diet alone;  Chems- wnl;  CBC- wnl;  TSH=0.79;  PSA=0.00;  Urine- clear...  ~  November 03, 2013:  111moOV & Fred [name] reports a good interval w/o new complaints or concerns; getting regular exercise (retired) and feeling well- he notes some troublesome sinus  drainage and recent "cold" for which we called in a Pred dosepak but he notes min change; we discussed Rx w/ OTC Antihist, Nasal saline mist Q1-2h, Mucinex- 2Bid, Fluticasone 2sp each nostril Qhs, and Rx for Astelin- 2sp each nostril Bid as needed; he still smokes a pipe regularly & again asked to quit! [facility]... We reviewed the following medical problems during today's office visit >>     COPD, Emphysematous bleb, s/p RUL bullectomy 1997 for infected bleb w/ A/F level, pipe smoker> on Advair250, Spiriva; no intercurrent infections, min cough & sput, no hemoptysis, stable DOE w/o change, no edema, etc; he reports cut back on pipe smoking to ~50% of former level & asked to decr further & work on smoking cessation! CXR is stable, unchanged...    Chol> on diet alone & his weight is good; FLP 4/15 showed TChol 174, TG 55, HDL 43, LDL 120; he does not want meds despite not being optimal, reviewed diet...    GERD> on Nexium40; Protonix wasn't helpful; he denies adb pain, dysphagia, n/v, c/d, blood seen...     Colon Polyp & +FamHx colon ca (bro died at 52)> last colon 07/2022-02-03/ divertics & tiny polyp, f/u due 1/15 and he remains asymptomatic (he will get this  in [facility])...    Prostate cancer> s/p robotic prostatectomy 5/10 & DrChapman in [place] released [place] 9/12 & asked [name] to monitor PSA yearly; Labs today showed PSA= 0.00 We reviewed prob list, meds, xrays and labs> see below for updates >> he did not get the 2014 Flu vaccine last fall & reminded to get this every yr at local pharm...  CXR 4/15 showed norm heart size, vol loss on right w/ pleuroparenchymal scarring, hyperinflation on left w/ bullous emphysema, stable- NAD...  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;  He reports breathing is doing OK & he had one infectious exac 2/16 (we called in meds); unfortunately he is still smoking a pipe "I enjoy it" but less tobacco than before he says; in addition he has been using his Spiriva "prn" as he freq forgets this dose-- we had a productive conversation about his bullous emphysema/ prev surg/ etc, I indicated that while I am delighted that he remains relatively asymptomatic at this time, that he is more rapidly approaching a life of deteriorating resp function w/ daily symptoms/ chronic resp failure/ need for oxygen 24/7, etc; he understands this & I have made yet another pitch for complete smoking cessation & taking his meds regularly day-in and day-out...     COPD, Emphysematous bleb, s/p RUL bullectomy 1997 for infected bleb w/ A/F level, pipe smoker> on Advair250Bid, Spiriva daily; no intercurrent infections, min cough & sput, no hemoptysis, stable DOE w/o change, no edema, etc; he reports cut back on pipe smoking but must quit completely as above; CXR is stable, unchanged w/ chr changes on right & bullous dis on left...    Chol> on diet alone & his weight is good; FLP 7/16 showed TChol 154, TG 79, HDL 41, LDL 97 => improved, keep up the good work!    GERD> on Nexium40; Protonix wasn't helpful; he denies abd pain, dysphagia, n/v, c/d, blood seen; he tried off the Nexium- symptoms returned therefore back on the PPI daily...    Colon Polyp & +FamHx colon ca (bro died at 52)> last colon 119-Jan-2024w/ divertics & tiny polyp, f/u done in [place] in 2015, he reports neg & [place]    Prostate cancer> s/p robotic prostatectomy 5/10 [place] asked [place] PSA yearly; Labs today showed PSA= 0.00, requests Cialis for daily use. We reviewed prob list, meds, xrays and labs> see below for updates >> Given PREVNAR-13 today...  CXR  02/17/15 showed norm heart size, severe COPD w/ bullous changes on left & s/p surg/ scarring on right, NAD/ no change from 2015...  Spirometry 02/17/15 showed FVC=3.25 (64%), FEV1=1.82 (46%), %1sec=56, mid-flows reduced at 21% predicted; c/w mod airflow obstruction & GOLD Stage3 COPD (can't r/o superimposed restriction w/o LVs)...  LABS 02/17/15:  FLP- all parameters at goals on diet;  Chems- wnl;  CBC- wnl;  TSH=1.10;  PSA= 0.00    IMP/PLAN>>  PFT shows functional deterioration from last studies, now GOLD Stage3, CXR unchanged, Labs OK;  He knows the score & must quit all smoking, take meds every day, try  to avoid infections, increase exercise program..    ~  September 25, 2016:  54mo ROV & [name]>  Tyjuan reports doing well & only recalls one resp exac since last visit- records in Epic/ CareEverywhere reviewed>>    He called here 03/21/16 c/o nasal congestion, chest tight, cough w/ clear mucus;  We called in Pred dosepak, Mucinex, nasal saline...    He called back 8/31 c/o persistent cough (now nonproductive) chest tight/ SOB, denied f/c/s;  Asked to go to PCP vs UC for exam & to be checked...    He saw PCP 8/31 at [facility], [place], [facility]> c/o cough & cold-like sx for 1wk, T101.5, CXR showed LUL pneumonia, given IM-Rocephin, Levaquin500 x10d, Tussionex...    He was f/u PCP-Christensen on 9/5> c/o persist fever/ cough/ congestion but feeling sl better; Temp 100.8 &he was given Rocephin IM, continue Levaquin...    He was rechecked 9/7> clinically improved, T98.9, given 10 more days of Levaquin500...    Pt returned 10/13> c/o 1wk hx recurrent cough, sputum, congestion & Dx w/ bronchitis=> given another 10d Levaquin500, no f/u CXR done. Currently he notes a mild "cold" w/ min cough, no sput, no hemoptysis, no f/c/s, & stable SOB/DOE esp if going uphill, pushing wheelbarrow, etc... 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 we reviewed the following medical problems during today's office visit >>     COPD, Emphysematous bleb, s/p RUL bullectomy 1997 for infected bleb w/ A/F level, pipe smoker> on Advair250Bid, Spiriva daily; min cough & sput, no hemoptysis, stable DOE w/o change, no edema, etc; he reports cut back on pipe smoking but must quit completely as above; CXR was stable x yrs w/ chr changes on right & bullous dis on left; then f/u CXR 09/25/16 showed new LUL lesion => CT pending.    New LUL peripheral lesion/ CXR abnormality> found on routine CXR 09/25/16 => work-up in progress w/ CT Chest pending...    Chol> on diet alone & his weight is good; FLP 2/18 showed TChol 164, TG 71, HDL 42, LDL 108 => stable & doing satis on diet...    GERD> on Nexium40; Protonix wasn't helpful; he denies abd pain, dysphagia, n/v, c/d, blood seen; he tried off the Nexium- symptoms returned therefore back on the PPI daily...    Colon Polyp & +FamHx colon ca (bro died at 52)> colon 08-21-2022 w/ divertics & tiny polyp, f/u done in [place] in 2015, he reports neg & will get copies sent to [name]     Prostate cancer> s/p robotic prostatectomy 5/10 & DrChapman in [place] released him 9/12 & asked [name] to monitor PSA yearly; Labs today showed PSA= 0.00, requests Cialis for daily use. EXAM shows Afeb, VSS, O2sat=99% on RA;  HEENT- neg, mallampati1;  Chest- decr BS but clear w/o w/r/r;  Heart- RR gr1/6 SEM w/o r/g;  Abd- soft, nontender, neg;  Ext- neg w/o c/c/e;  Neuro- intact w/o focal abn...  CXR 09/25/16>  Norm heart size, new pleuroparenchymal thickening & nodular density in left apex measuring about 2cm, increased interstitial markings bilat, emphysema & bullous changes bilat...  EKG 09/25/16>  NSR, rate62, IVCD, NAD...  LABS 09/25/16>  FLP- ok w/ LDL=108 on diet alone;  Chems- wnl;  CBC- wnl;  TSH=1.27;  PSA=0.00    IMP/PLAN>>  Toshiro has a new abn on his f/u CXR in the left apex=> needs CT Chest ASAP for further  eval & we will  sched;  In the meanwhile we (again) reviewed the need to quit all smoking (he still smokes a pipe) and take meds regularly- Advair250Bid & Spiriva daily;  He needs to incr his exercise in a gradual exercise program as well;  We gave him the 2017 FLU vaccine today & a prescription for a ZPak to keep on hand.  ADDENDUM>>  CT CHEST w/ contrast 10/02/16 showed norm heart size, atherosclerosis Ao & L-Circ, norm caliber PAs & no PE seen; mod centrilob & paraseptal emhysema w/ bullous changes bilat & s/p RUL wedge resection w/ post surg scarring; thick walled irreg 6.5 x 3.7 cm cavitary lesion in LUL apical-post;  2 small nodules 4-28mm size in lingula and RML noted; two 1.0-1.2cm nodes (L mediastinal & R hilar); s/pGB & 2cm R-renal cyst... NOTE: we discussed CT results and options=> decided to proceed w/ PET scan...  ADDENDUM>>  PET scan 10/23/16 showed a cavitary left apical process w/ nodularity along its inferior border tracking along the pleural surface (similar appearance to the recent CT Chest)- max assoc SUV is along this inferior pleural nodularity w/ max SUV=5.8;  Underlying emphysema, calcif biapical scarring R>L;  approx 6 x 2 cm periph density LLL posteromedially w/ max SUV=2.2 & likely benign;  Right hilar adenopathy is faintly hypermet w/ max SUV=3.0;  Incidental focal metabolic actiivity along the cecum w/ SUV max=9.7 (tumor cannot be excluded, no adenopathy apprec in this area)=> we will refer to his GI...  I reviewed these scans w/ Radiology & their IR team=> they felt that needle bx LUL could be done but carried a signif risk of pneumothorax & they suggested watchful waiting w/ repeat CT Chest in 3-4 months;  I called Pt & wife to review all this & they are in agreement w/ conservative approach & repeat scan in [id number] is planned;  They know to call me for any problems, Eason will call his gastroenterologist for OV to discuss PET scan & address ?need for repeat colon at this time...    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 He remains on ADVAIR250Bid & SPIRIVA once daily, uses Mucinex prn, and has had ZPak & Cipro given in the interim;  He still smokes the pipe- "I'm cutting back every day" & I implored him to quit completely... we reviewed the following medical problems during today's office visit >>     COPD, Emphysematous blebs, s/p RUL bullectomy 1997 for infected bleb w/ A/F level, pipe smoker> on Advair250Bid, Spiriva daily; min cough & sput, no hemoptysis, stable DOE w/o change, no edema, etc; he reports cut back on pipe smoking but must quit completely as above; CXR was stable x yrs w/ chr changes on right & bullous dis on left; then he developed LUL pneumonia Fall2017 treated in [place] w/ Levaquin, & f/u CXR here 09/25/16 showed new LUL  abnormality => CT Chest, subseq PET scan, and serial CT scans as documented...    Hx LUL pneumonia [id number] w/ residual LUL parenchymal abnormality> clinically pt is feeling & breathing well, at baseline, w/o much cough/ sputum/ etc; we considered bronch & lavage- following serial scans for now...    Chol> on diet alone & his weight is good; FLP 2/18 showed TChol 164, TG 71, HDL 42, LDL 108 => stable & doing satis on diet...    GERD> on Nexium40; Protonix wasn't helpful; he denies abd pain, dysphagia, n/v, c/d, blood seen; he tried off the Nexium- symptoms returned therefore back on the PPI daily...    Colon Polyp & +FamHx colon ca  (bro died at 52)> colon 09-04-22 w/ divertics & tiny polyp, f/u done in [place] in 2015, he reports neg & will get copies sent to [name]; PET scan 09/2016 for lung abn showed focal metabolic activ along the cecum (SUV 9.7) & he had f/u w/ DrSingh in [place] w/ repeat colon showing 1 sm polyp, otherw neg...    Prostate cancer> s/p robotic prostatectomy 5/10 & DrChapman in [place] released him 9/12 & asked [name] to monitor PSA yearly; Labs 2/18 showed PSA= 0.00, requests Cialis for daily use. EXAM shows Afeb, VSS, O2sat=98% on RA;  HEENT- neg, mallampati1;  Chest- decr BS but clear w/o w/r/r;  Heart- RR gr1/6 SEM w/o r/g;  Abd- soft, nontender, neg;  Ext- neg w/o c/c/e;  Neuro- intact w/o focal abn...  CT Chest 01/28/17 (independently reviewed by me in the PACS system)>  Norm heart size w/ Ao & coronary atherosclerosis; no adenopathy, left sided pleural thickening (esp medialy & inferiorly); advanced bullous emphysema w/ centrilob & paraseptal components; right apical pleuro-parenchymal scarring w/ nodular components; cavitary left apical process w/ nodular component inferiorly & cavitary wall thickening...  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 I presented case to DrNestor who agreed w/ the conservative approach at this point in light of no clear cut signs of worsening parenchymal dis... We will recheck pt in Nov w/ another CT Chest...           Problem List:  ALLERGY (ICD-995.3) - uses OTC antihistamines Prn...  COPD (ICD-496) & EMPHYSEMATOUS BLEB (ICD-492.0) - on  [place] daily... unfortunately he still smokes a pipe daily (w/ min cough, drainage, etc)... he has a neg FamHx of lung disease and a normal A1AT level (MM phenotype)... he is s/p VATS- RUL bullectomy in 1997 for infected bulla w/ A/F level. ~  baseline CXR's back to 1990 showed large bilat bullae R>L & scarring at left base... ~  f/u CXR's w/ vol loss on right from the surg, bullae on the left, scarring, etc... ~  prev CT Chest revealed mult blebs & bullae in RUL & LUL to the lingular w/ scarring...  ~  1997= 14cm RUL bulla w/ AF level (required VATS Bullectomy- path= emphysema, infected bulla w/ acute/ chr inflamm). ~  CT Chest 10/09 in [place]= similar bullous dis on left w/ emphysema, post-op changes on right, NAD,etc. ~  PFT 6/00 showed FVC= 4.41 (84%), FEV1= 2.53 (59%), %1sec= 57, mid-flows= 25%pred... ~  PFT 6/06 showed FVC= 4.59 (87%), FEV1= 2.79 (65%), %1sec= 61, mid-flows= 27%pred... ~  CXR 5/11 showed advanced chr lung dis w/ hyperinflation, scarring, decr vol on right> NAD. ~  PFT 9/12 showed FVC= 4.07 (79%), FEV1= 2.34 (57%), %1sec= 57, mid-flows= 24%pred... c/w GOLD Stage2 COPD- MUST QUIT ALL SMOKING! ~  CXR 9/12 showed no signif change in his bullous lung dis & evid of prev surg on the right... Asked again to quit the pipe. 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 Must quit all smoking! 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 ~  Spirometry 02/17/15 showed FVC=3.25 (64%), FEV1=1.82 (46%), %1sec=56, mid-flows reduced at 21% predicted; c/w mod airflow obstruction & GOLD Stage3 COPD (can't r/o superimposed restriction w/o LVs)... ~  CXR 09/25/16>  Norm heart size, new pleuroparenchymal thickening & nodular  density in left apex measuring about 2cm, increased interstitial markings bilat, emphysema & bullous changes bilat => proceed w/ CT Chest.  ABN CXR w/ LUL peripheral lesion found on routine CXR 09/25/16=> eval in progress...  Hx of CHEST PAIN, ATYPICAL (ICD-786.59) - on ASA 81mg /d... hx intermittent chest wall tightness/ spasm/ ?palpit/ numb in left arm- last 2-3 min and resolves spont, not exercise induced... s/p cardiac eval 2/08 by DrCooper- he wanted to do treadmill and holter but pt never did these and states he is doing fine w/o any incr in symptoms... ~  EKG 5/11 showed NSR 74/min, IVCD, NAD (no ch from old tracings) ~  EKG 9/12 showed NSR 68/min, IVCD, NAD & no change...  HYPERCHOLESTEROLEMIA, MILD (ICD-272.0) - on diet alone... ~  [place] 12/07 showed TChol 182, TG 80, HDL 36, LDL 130... he prefers diet Rx. ~  [place] 12/09 showed TChol 172, TG 81, HDL 36, LDL 120... rec- improve diet efforts, or consider statin. ~  FLP 5/11 showed TChol 169, TG 94, HDL 37, LDL 113... continue diet efforts. ~  FLP 9/12 showed TChol 172, TG 52, HDL 43, LDL 118 ~  FLP 10/13 on diet alone showed TChol 198, TG 99, HDL 47, LDL 131... Offered Crestor5mg /d, he will decide. ~  FLP 4/15 on diet alone showed TChol 174, TG 55, HDL 43, LDL 120... He prefers diet/ exercise & declined low dose statin rx. ~  FLP 7/16 on diet alone showed TChol 154, TG 79, HDL 41, LDL 97 ~  FLP 2/18 on diet alone showed TChol 164, TG 71, HDL 42, LDL 108  GERD (ICD-530.81) - on NEXIUM 40mg /d... he states he tried off PPI meds but had to restart due to signif heartburn... ~  9/12:  He reports that insurance co required switch off Nexium onto Protonix but the latter isn't working w/ return of severe reflux symptoms & he wants to restart the [place] 40mg /d... ~  10/13:  Doing satis back on the Nexium, but trouble w/ coverage thru his insurance... ~  4/15:  He continues stable on the Nexium40...  COLONIC POLYPS (ICD-211.3) & Family Hx of  ADENOCARCINOMA, COLON (ICD-153.9)  - colonoscopy 7/01 by DrPatterson showed diminutive rectal polyp= hyperplastic... f/u planned 26yrs but pt cancelled and never rescheduled the procedure... f/u colonoscopy  done 1/10 showed divertics & tiny polyp w/o adenomatous change (+FamHx of colon cancer in brother who died of the disease at age 23)... Repeat due 1/15. ~  4/15:  He knows that he is due for f/u colonoscopy & will get this from a gastroenterologist in [facility] => he tells me this was done, we do not have record.  ADENOCARCINOMA, PROSTATE (ICD-185) >> ~  5/11:  when seen 12/09 his routine PSA was 5.98 & he was referred to [place] in [facility] where he lives for eval> +prostate cancer and s/p robotic prostatectomy 5/10 & doing well so far w/ PSA <0.01 on last check 11/10 w/ f/u due soon (Q79mo). ~  He had episode of micturition syncope that occurred one night- awoke w/ full bladder & was standing to urinate, then fell w/ transient syncope, injured tooth & fx rib; recovered uneventfully & he is asked to sit down to empty bladder, stand slowly & be careful> no recurrence. ~  9/12:  He tells me that [place] "released me" & he wants [name] to do yearly PSA f/u here; PSA today = zero... ~  10/13:  Clinically stable w/o voiding difficulty, incont, etc; PSA= 0.00 ~  4/15:  He remains clinically stable w/o complaints and f/u PSA remains zero... ~  7/16:  PSA remains 0.00, he is asking about Cialis for daily use => given Rx.  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  ANXIETY (ICD-300.00)  Health Maintenance >> ~  GI:  His brother died age 33 w/ colon cancer; pt had hyperplastic polyp in past; last colonoscopy was 1/10 by DrPatterson- neg, f/u planned 61yrs. ~  GU:  Hx prostate cancer Dx 2010 w/ Bx DrChapman in [place]; s/p robotic surg 5/10 & doing well since then; PSA= zero ~  Immuniz:  He had PNEUMOVAX in 1999, and gets yearly flu shots... Pneumovax f/u vaccination  given 12/09... Given TDAP 5/11   Past Surgical History:  Procedure Laterality Date  . CHOLECYSTECTOMY  2004  . OTHER SURGICAL HISTORY     S/P VATS w/ infected bleb removed from RUL 1997  . PROSTATECTOMY  11/2008   S/P robotic prostatectomy in [facility]    Outpatient Encounter Prescriptions as of 01/28/2017  Medication Sig  . aspirin 325 MG tablet Take 325 mg by mouth daily.    Marland Kitchen azithromycin (ZITHROMAX) 250 MG tablet Take as directed  . CIALIS 5 MG tablet Take 1 tablet by mouth  daily  . esomeprazole (NEXIUM) 40 MG capsule Take 1 capsule (40 mg total) by mouth daily at 12 noon.  . Fluticasone-Salmeterol (ADVAIR DISKUS) 250-50 MCG/DOSE AEPB Use 1 inhalation orally  twice daily  . Multiple Vitamin (MULTIVITAMIN) tablet Take 1 tablet by mouth daily.    Marland Kitchen tiotropium (SPIRIVA HANDIHALER) 18 MCG inhalation capsule INHALE THE CONTENTS OF 1  CAPSULE VIA HANDIHALER  DAILY  . [DISCONTINUED] ciprofloxacin (CIPRO) 500 MG tablet Take 1 tablet (500 mg total) by mouth 2 (two) times daily. (Patient not taking: Reported on 01/28/2017)  . [DISCONTINUED] methylPREDNISolone (MEDROL DOSEPAK) 4 MG TBPK tablet Take as directed- tapered pack (Patient not taking: Reported on 01/28/2017)   No facility-administered encounter medications on file as of 01/28/2017.     No Known Allergies    Immunization History  Administered Date(s) Administered  . H1N1 07/01/2008  . Influenza Split 04/26/2011, 05/01/2012  . Influenza,inj,Quad PF,36+ Mos 08/17/2015, 08/21/2016  . Pneumococcal Conjugate-13 02/17/2015  . Pneumococcal Polysaccharide-23 07/01/2008  . Td  12/22/2009  . Tdap 04/26/2011    Current Medications, Allergies, Past Medical History, Past Surgical History, Family History, and Social History were reviewed in [employer] record.     Review of Systems        Alie is remarkably devoid of symptoms- CC is nasal congestion and drainage, min cough w/ clear sput.  The patient denies fever, chills,  sweats, anorexia, fatigue, weakness, malaise, weight loss, sleep disorder, blurring, diplopia, eye irritation, eye discharge, vision loss, eye pain, photophobia, earache, ear discharge, tinnitus, decreased hearing, nasal congestion, nosebleeds, sore throat, hoarseness, chest pain, palpitations, syncope, orthopnea, PND, peripheral edema, dyspnea at rest, excessive sputum, hemoptysis, wheezing, pleurisy, nausea, vomiting, diarrhea, constipation, change in bowel habits, abdominal pain, melena, hematochezia, jaundice, gas/bloating, indigestion/heartburn, dysphagia, odynophagia, dysuria, hematuria, urinary frequency, urinary hesitancy, nocturia, incontinence, back pain, joint pain, joint swelling, muscle cramps, muscle weakness, stiffness, arthritis, sciatica, restless legs, leg pain at night, leg pain with exertion, rash, itching, dryness, suspicious lesions, paralysis, paresthesias, seizures, tremors, vertigo, transient blindness, frequent falls, frequent headaches, difficulty walking, depression, anxiety, memory loss, confusion, cold intolerance, heat intolerance, polydipsia, polyphagia, polyuria, unusual weight change, abnormal bruising, bleeding, enlarged lymph nodes, urticaria, allergic rash, hay fever, and recurrent infections.     Objective:   Physical Exam    WD, WN, 61 y/o WM in NAD... GENERAL:  Alert & oriented; pleasant & cooperative... HEENT:  [place]/AT, EOM-wnl, PERRLA, Fundi-benign, EACs-clear, TMs-wnl, NOSE-clear, THROAT-clear & wnl. NECK:  Supple w/ fairROM; no JVD; normal carotid impulses w/o bruits; no thyromegaly or nodules palpated; no lymphadenopathy. CHEST:  decr BS bilat, clear to P & A; without wheezes/ rales/ or rhonchi heard... HEART:  Regular Rhythm; without murmurs/ rubs/ or gallops. ABDOMEN:  Soft & nontender; normal bowel sounds; no organomegaly or masses detected. EXT: without deformities or arthritic changes; no varicose veins/ venous insuffic/ or edema. NEURO:  CN's intact;  motor testing normal; sensory testing normal; gait normal & balance OK. DERM:  No lesions noted; no rash etc...  RADIOLOGY DATA:  Reviewed in the EPIC EMR & discussed w/ the patient...  LABORATORY DATA:  Reviewed in the EPIC EMR & discussed w/ the patient...   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 Continue [place], Mucinex as needed, etc...  NEW CXR ABNORMALITY 2/18 w/ LUL peripheral abnormality that occurred in the wake of a LUL pneumonia from Oct2017- treated w/ Levaquin in [place];  He has had CT, LABS, PET => see above & decision made to continue close follow up w/ serial CXR/ scans...  01/28/17>    We reviewed the serial CT findings & our options;  he remains totally asymptomatic- active in home remodeling, climbing, crawling, working pretty hard w/o dyspnea etc;  In fact his CC is sinus drainage;  Options are to continue current course- no pipe, +Advair/ Spiriva, exercise program, avoid exposures and repeat CT Chest similar 11mo;  Alternatively we could consider Bronch w/ Lavage for cytology & cultures, or consider second opinion at [facility] near his home... I presented case to DrNestor who agreed w/ the conservative approach at this point in light of no clear cut signs of worsening parenchymal dis... We will recheck pt in Nov w/ another CT Chest.   Hx AtypCP>  No further chest discomfort; EKG w/ NSR, IVCD, NAD.Marland KitchenMarland Kitchen  Borderline CHOL>  LDL not at goal but he is not inclined to take statin Rx; therefore diet alone...  GERD>  He has signif symptoms and NOT improved w/ Protonix; NEXIUM is the only PPI that works for him & we will re-write this med...  Colon Polyps>  As noted brother died age 76 w/ colon cancer; last colonoscopy 1/10 was ok & f/u due now- he will get this in [facility]...  PROSTATE CANCER> DrChapman  in [place] has released him & asked that we do yearly PSA; todays lab w/ PSA= zero... NOTE: hx one episode of micturition syncope w/o recurrence...  DJD>  Stable, uses OTC analgesics as needed...  Anxiety>  Aware, stable, no meds required...   Patient's Medications  New Prescriptions   No medications on file  Previous Medications   ASPIRIN 325 MG TABLET    Take 325 mg by mouth daily.     AZITHROMYCIN (ZITHROMAX) 250 MG TABLET    Take as directed   CIALIS 5 MG TABLET    Take 1 tablet by mouth  daily   ESOMEPRAZOLE (NEXIUM) 40 MG CAPSULE    Take 1 capsule (40 mg total) by mouth daily at 12 noon.   FLUTICASONE-SALMETEROL (ADVAIR DISKUS) 250-50 MCG/DOSE AEPB    Use 1 inhalation orally  twice daily   MULTIPLE VITAMIN (MULTIVITAMIN) TABLET    Take 1 tablet by mouth daily.     TIOTROPIUM (SPIRIVA HANDIHALER) 18 MCG INHALATION CAPSULE    INHALE THE CONTENTS OF 1  CAPSULE VIA HANDIHALER  DAILY  Modified Medications   No medications on file  Discontinued Medications   CIPROFLOXACIN (CIPRO) 500 MG TABLET    Take 1 tablet (500 mg total) by mouth 2 (two) times daily.   METHYLPREDNISOLONE (MEDROL DOSEPAK) 4 MG TBPK TABLET    Take as directed- tapered pack

## 2017-02-03 ENCOUNTER — Other Ambulatory Visit: Payer: Self-pay | Admitting: Pulmonary Disease

## 2017-02-03 DIAGNOSIS — R9389 Abnormal findings on diagnostic imaging of other specified body structures: Secondary | ICD-10-CM

## 2017-02-18 ENCOUNTER — Other Ambulatory Visit: Payer: Self-pay | Admitting: Pulmonary Disease

## 2017-02-21 ENCOUNTER — Telehealth: Payer: Self-pay | Admitting: Pulmonary Disease

## 2017-02-21 DIAGNOSIS — J432 Centrilobular emphysema: Secondary | ICD-10-CM

## 2017-02-21 MED ORDER — TADALAFIL 5 MG PO TABS: 5.0000 mg | ORAL_TABLET | Freq: Every day | ORAL | 1 refills | Status: DC

## 2017-02-21 NOTE — Telephone Encounter (Signed)
Refill was sent and pt aware Nothing further needed

## 2017-02-25 ENCOUNTER — Encounter: Payer: Self-pay | Admitting: Pulmonary Disease

## 2017-02-26 NOTE — Telephone Encounter (Signed)
Please advise if okay for 90 day supply on his Cialis 5 mg  Med list states he takes this once daily scheduled  Just want to confirm that this is correct, thanks

## 2017-02-28 ENCOUNTER — Other Ambulatory Visit: Payer: Self-pay | Admitting: *Deleted

## 2017-02-28 MED ORDER — TADALAFIL 5 MG PO TABS: 5.0000 mg | ORAL_TABLET | Freq: Every day | ORAL | 1 refills | Status: DC

## 2017-04-22 ENCOUNTER — Encounter: Payer: Self-pay | Admitting: Pulmonary Disease

## 2017-05-15 ENCOUNTER — Other Ambulatory Visit: Payer: Self-pay | Admitting: Pulmonary Disease

## 2017-06-05 ENCOUNTER — Other Ambulatory Visit: Payer: 59

## 2017-06-05 ENCOUNTER — Ambulatory Visit: Payer: 59 | Admitting: Pulmonary Disease

## 2017-06-09 ENCOUNTER — Encounter: Payer: Self-pay | Admitting: Pulmonary Disease

## 2017-06-09 ENCOUNTER — Ambulatory Visit (INDEPENDENT_AMBULATORY_CARE_PROVIDER_SITE_OTHER)
Admission: RE | Admit: 2017-06-09 | Discharge: 2017-06-09 | Disposition: A | Discharge status: Home / Self Care | Payer: 59 | Source: Ambulatory Visit | Attending: Pulmonary Disease | Admitting: Pulmonary Disease

## 2017-06-09 ENCOUNTER — Ambulatory Visit (INDEPENDENT_AMBULATORY_CARE_PROVIDER_SITE_OTHER): Payer: 59 | Admitting: Pulmonary Disease

## 2017-06-09 VITALS — BP 118/66 | HR 80 | Ht 72.0 in | Wt 184.0 lb

## 2017-06-09 DIAGNOSIS — Z23 Encounter for immunization: Secondary | ICD-10-CM | POA: Diagnosis not present

## 2017-06-09 DIAGNOSIS — J439 Emphysema, unspecified: Secondary | ICD-10-CM | POA: Diagnosis not present

## 2017-06-09 DIAGNOSIS — R9389 Abnormal findings on diagnostic imaging of other specified body structures: Secondary | ICD-10-CM

## 2017-06-09 DIAGNOSIS — F17290 Nicotine dependence, other tobacco product, uncomplicated: Secondary | ICD-10-CM

## 2017-06-09 DIAGNOSIS — J432 Centrilobular emphysema: Secondary | ICD-10-CM | POA: Diagnosis not present

## 2017-06-09 NOTE — Progress Notes (Signed)
Subjective:    Patient ID: Fred [name], male    DOB: 12-10-1955, 61 y.o.   MRN: [id number]  HPI 61 y/o WM here for a follow up visit... he has multiple medical problems as noted below...  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 ~  SEE PREV EPIC NOTES FOR OLDER DATA >>   ~  May 01, 2102:  [name] CPX> [name] has had a good year, no new complaints or concerns except his insurance co difficulty w/ Nexium64m/d & he will check w/ [name] & 30d vs 90d via his [employer]...    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 CXR is unchanged...    Chol> on diet alone; he had recent business trip & is worried his diet wasn't what it should have been; FLP showed TChol 198, TG 99, HDL 47, LDL 131    GERD> on Nexium40; Protonix wasn't helpful & he is hopeful that his insurance co will cover the needed Nexium...    Colon Polyp & +FamHx colon ca> last colon 102/03/24w/ divertics & tiny polyp, f/u due 1/15 and he remains asymptomatic...    Prostate cancer> s/p robotic [place] 5/10 [place]; Labs today showed PSA= 0.00 We reviewed prob list, meds, xrays and labs> see below for updates >> OK 2013 Flu vaccine today...  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  LABS 10/13:  FLP- ok x LDL=131 on diet alone;  Chems- wnl;  CBC- wnl;  TSH=0.79;  PSA=0.00;  Urine- clear...  ~  November 03, 2013:  111moOV & [name] reports a good interval w/o new complaints or concerns; getting regular exercise (retired) and feeling well- he notes some troublesome sinus  drainage and recent "cold" for which we called in a Pred dosepak but he notes min change; we discussed Rx w/ OTC Antihist, Nasal saline mist Q1-2h, Mucinex- 2Bid, Fluticasone 2sp each nostril Qhs, and Rx for Astelin- 2sp each nostril Bid as needed; he still smokes a pipe regularly & again asked to quit! [facility]... We reviewed the following medical problems during today's office visit >>     COPD, Emphysematous bleb, s/p RUL bullectomy 1997 for infected bleb w/ A/F level, pipe smoker> on Advair250, Spiriva; no intercurrent infections, min cough & sput, no hemoptysis, stable DOE w/o change, no edema, etc; he reports cut back on pipe smoking to ~50% of former level & asked to decr further & work on smoking cessation! CXR is stable, unchanged...    Chol> on diet alone & his weight is good; FLP 4/15 showed TChol 174, TG 55, HDL 43, LDL 120; he does not want meds despite not being optimal, reviewed diet...    GERD> on Nexium40; Protonix wasn't helpful; he denies adb pain, dysphagia, n/v, c/d, blood seen...     Colon Polyp & +FamHx colon ca (bro died at 52)> last colon 07/2022-02-03/ divertics & tiny polyp, f/u due 1/15 and he remains asymptomatic (he will get this  in [facility])...    Prostate cancer> s/p robotic prostatectomy 5/10 & DrChapman in [place] released [place] 9/12 & asked [name] to monitor PSA yearly; Labs today showed PSA= 0.00 We reviewed prob list, meds, xrays and labs> see below for updates >> he did not get the 2014 Flu vaccine last fall & reminded to get this every yr at local pharm...  CXR 4/15 showed norm heart size, vol loss on right w/ pleuroparenchymal scarring, hyperinflation on left w/ bullous emphysema, stable- NAD...  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;  He reports breathing is doing OK & he had one infectious exac 2/16 (we called in meds); unfortunately he is still smoking a pipe "I enjoy it" but less tobacco than before he says; in addition he has been using his Spiriva "prn" as he freq forgets this dose-- we had a productive conversation about his bullous emphysema/ prev surg/ etc, I indicated that while I am delighted that he remains relatively asymptomatic at this time, that he is more rapidly approaching a life of deteriorating resp function w/ daily symptoms/ chronic resp failure/ need for oxygen 24/7, etc; he understands this & I have made yet another pitch for complete smoking cessation & taking his meds regularly day-in and day-out...     COPD, Emphysematous bleb, s/p RUL bullectomy 1997 for infected bleb w/ A/F level, pipe smoker> on Advair250Bid, Spiriva daily; no intercurrent infections, min cough & sput, no hemoptysis, stable DOE w/o change, no edema, etc; he reports cut back on pipe smoking but must quit completely as above; CXR is stable, unchanged w/ chr changes on right & bullous dis on left...    Chol> on diet alone & his weight is good; FLP 7/16 showed TChol 154, TG 79, HDL 41, LDL 97 => improved, keep up the good work!    GERD> on Nexium40; Protonix wasn't helpful; he denies abd pain, dysphagia, n/v, c/d, blood seen; he tried off the Nexium- symptoms returned therefore back on the PPI daily...    Colon Polyp & +FamHx colon ca (bro died at 52)> last colon 119-Jan-2024w/ divertics & tiny polyp, f/u done in [place] in 2015, he reports neg & [place]    Prostate cancer> s/p robotic prostatectomy 5/10 [place] asked [place] PSA yearly; Labs today showed PSA= 0.00, requests Cialis for daily use. We reviewed prob list, meds, xrays and labs> see below for updates >> Given PREVNAR-13 today...  CXR  02/17/15 showed norm heart size, severe COPD w/ bullous changes on left & s/p surg/ scarring on right, NAD/ no change from 2015...  Spirometry 02/17/15 showed FVC=3.25 (64%), FEV1=1.82 (46%), %1sec=56, mid-flows reduced at 21% predicted; c/w mod airflow obstruction & GOLD Stage3 COPD (can't r/o superimposed restriction w/o LVs)...  LABS 02/17/15:  FLP- all parameters at goals on diet;  Chems- wnl;  CBC- wnl;  TSH=1.10;  PSA= 0.00    IMP/PLAN>>  PFT shows functional deterioration from last studies, now GOLD Stage3, CXR unchanged, Labs OK;  He knows the score & must quit all smoking, take meds every day, try  to avoid infections, increase exercise program..    ~  September 25, 2016:  66mo ROV & CPX>  Garner reports doing well & only recalls one resp exac since last visit- records in Epic/ CareEverywhere reviewed>>    He called here 03/21/16 c/o nasal congestion, chest tight, cough w/ clear mucus;  We called in Pred dosepak, Mucinex, nasal saline...    He called back 8/31 c/o persistent cough (now nonproductive) chest tight/ SOB, denied f/c/s;  Asked to go to PCP vs UC for exam & to be checked...    He saw PCP 8/31 at [facility], [place], [facility]> c/o cough & cold-like sx for 1wk, T101.5, CXR showed LUL pneumonia, given IM-Rocephin, Levaquin500 x10d, Tussionex...    He was f/u PCP-Christensen on 9/5> c/o persist fever/ cough/ congestion but feeling sl better; Temp 100.8 &he was given Rocephin IM, continue Levaquin...    He was rechecked 9/7> clinically improved, T98.9, given 10 more days of Levaquin500...    Pt returned 10/13> c/o 1wk hx recurrent cough, sputum, congestion & Dx w/ bronchitis=> given another 10d Levaquin500, no f/u CXR done. Currently he notes a mild "cold" w/ min cough, no sput, no hemoptysis, no f/c/s, & stable SOB/DOE esp if going uphill, pushing wheelbarrow, etc... 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 we reviewed the following medical problems during today's office visit >>     COPD, Emphysematous bleb, s/p RUL bullectomy 1997 for infected bleb w/ A/F level, pipe smoker> on Advair250Bid, Spiriva daily; min cough & sput, no hemoptysis, stable DOE w/o change, no edema, etc; he reports cut back on pipe smoking but must quit completely as above; CXR was stable x yrs w/ chr changes on right & bullous dis on left; then f/u CXR 09/25/16 showed new LUL lesion => CT pending.    New LUL peripheral lesion/ CXR abnormality> found on routine CXR 09/25/16 => work-up in progress w/ CT Chest pending...    Chol> on diet alone & his weight is good; FLP 2/18 showed TChol 164, TG 71, HDL 42, LDL 108 => stable & doing satis on diet...    GERD> on Nexium40; Protonix wasn't helpful; he denies abd pain, dysphagia, n/v, c/d, blood seen; he tried off the Nexium- symptoms returned therefore back on the PPI daily...    Colon Polyp & +FamHx colon ca (bro died at 52)> colon 2022/08/10 w/ divertics & tiny polyp, f/u done in [place] in 2015, he reports neg & will get copies sent to [name]     Prostate cancer> s/p robotic prostatectomy 5/10 & [place] 9[place] & asked [name] to monitor PSA yearly; Labs today showed PSA= 0.00, requests Cialis for daily use. EXAM shows Afeb, VSS, O2sat=99% on RA;  HEENT- neg, mallampati1;  Chest- decr BS but clear w/o w/r/r;  Heart- RR gr1/6 SEM w/o r/g;  Abd- soft, nontender, neg;  Ext- neg w/o c/c/e;  Neuro- intact w/o focal abn...  CXR 09/25/16>  Norm heart size, new pleuroparenchymal thickening & nodular density in left apex measuring about 2cm, increased interstitial markings bilat, emphysema & bullous changes bilat...  EKG 09/25/16>  NSR, rate62, IVCD, NAD...  LABS 09/25/16>  FLP- ok w/ LDL=108 on diet alone;  Chems- wnl;  CBC- wnl;  TSH=1.27;  PSA=0.00    IMP/PLAN>>  Shannan has a new abn on his f/u CXR in the left apex=> needs CT Chest ASAP for further  eval & we will  sched;  In the meanwhile we (again) reviewed the need to quit all smoking (he still smokes a pipe) and take meds regularly- Advair250Bid & Spiriva daily;  He needs to incr his exercise in a gradual exercise program as well;  We gave him the 2017 FLU vaccine today & a prescription for a ZPak to keep on hand.  ADDENDUM>>  CT CHEST w/ contrast 10/02/16 showed norm heart size, atherosclerosis Ao & L-Circ, norm caliber PAs & no PE seen; mod centrilob & paraseptal emhysema w/ bullous changes bilat & s/p RUL wedge resection w/ post surg scarring; thick walled irreg 6.5 x 3.7 cm cavitary lesion in LUL apical-post;  2 small nodules 4-30mm size in lingula and RML noted; two 1.0-1.2cm nodes (L mediastinal & R hilar); s/pGB & 2cm R-renal cyst... NOTE: we discussed CT results and options=> decided to proceed w/ PET scan...  ADDENDUM>>  PET scan 10/23/16 showed a cavitary left apical process w/ nodularity along its inferior border tracking along the pleural surface (similar appearance to the recent CT Chest)- max assoc SUV is along this inferior pleural nodularity w/ max SUV=5.8;  Underlying emphysema, calcif biapical scarring R>L;  approx 6 x 2 cm periph density LLL posteromedially w/ max SUV=2.2 & likely benign;  Right hilar adenopathy is faintly hypermet w/ max SUV=3.0;  Incidental focal metabolic actiivity along the cecum w/ SUV max=9.7 (tumor cannot be excluded, no adenopathy apprec in this area)=> we will refer to his GI...  I reviewed these scans w/ Radiology & their IR team=> they felt that needle bx LUL could be done but carried a signif risk of pneumothorax & they suggested watchful waiting w/ repeat CT Chest in 3-4 months;  I called Pt & wife to review all this & they are in agreement w/ conservative approach & repeat scan in [id number] is planned;  They know to call me for any problems, Jakhi will call his gastroenterologist for OV to discuss PET scan & address ?need for repeat colon at this time...    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 He remains on ADVAIR250Bid & SPIRIVA once daily, uses Mucinex prn, and has had ZPak & Cipro given in the interim;  He still smokes the pipe- "I'm cutting back every day" & I implored him to quit completely... we reviewed the following medical problems during today's office visit >>     COPD, Emphysematous blebs, s/p RUL bullectomy 1997 for infected bleb w/ A/F level, pipe smoker> on Advair250Bid, Spiriva daily; min cough & sput, no hemoptysis, stable DOE w/o change, no edema, etc; he reports cut back on pipe smoking but must quit completely as above; CXR was stable x yrs w/ chr changes on right & bullous dis on left; then he developed LUL pneumonia Fall2017 treated in [place] w/ Levaquin, & f/u CXR here 09/25/16 showed new LUL  abnormality => CT Chest, subseq PET scan, and serial CT scans as documented...    Hx LUL pneumonia [id number] w/ residual LUL parenchymal abnormality> clinically pt is feeling & breathing well, at baseline, w/o much cough/ sputum/ etc; we considered bronch & lavage- following serial scans for now...    Chol> on diet alone & his weight is good; FLP 2/18 showed TChol 164, TG 71, HDL 42, LDL 108 => stable & doing satis on diet...    GERD> on Nexium40; Protonix wasn't helpful; he denies abd pain, dysphagia, n/v, c/d, blood seen; he tried off the Nexium- symptoms returned therefore back on the PPI daily...    Colon Polyp & +FamHx colon ca  (bro died at 52)> colon 08-19-2022 w/ divertics & tiny polyp, f/u done in [place] in 2015, he reports neg & will get copies sent to [name]; PET scan 09/2016 for lung abn showed focal metabolic activ along the cecum (SUV 9.7) & he had f/u w/ DrSingh in [place] w/ repeat colon showing 1 sm polyp, otherw neg...    Prostate cancer> s/p robotic prostatectomy 5/10 & DrChapman in [place] released him 9/12 & asked [name] to monitor PSA yearly; Labs 2/18 showed PSA= 0.00, requests Cialis for daily use. EXAM shows Afeb, VSS, O2sat=98% on RA;  HEENT- neg, mallampati1;  Chest- decr BS but clear w/o w/r/r;  Heart- RR gr1/6 SEM w/o r/g;  Abd- soft, nontender, neg;  Ext- neg w/o c/c/e;  Neuro- intact w/o focal abn...  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 I presented case to DrNestor who agreed w/ the conservative approach at this point in light of no clear cut signs of worsening parenchymal dis... We will recheck pt in Nov w/ another CT Chest...   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 He is takingAdvair250Bid, Spiriva once daily, & he has a Zpak for prn use if nec...     Problem List as above- reviewed w/ pt>>  EXAM shows Afeb, VSS, O2sat=97% on RA;  HEENT- neg, mallampati1;  Chest- decr BS but clear w/o w/r/r;  Heart- RR gr1/6 SEM w/o r/g;  Abd- soft, nontender, neg;  Ext- neg w/o c/c/e;  Neuro- intact w/o focal abn...  CT Chest 06/09/17 (independently reviewed by me in the PACS system) shows norm heart size w/ Ao & coronary  atherosclerotic calcif; no adenopathy, biapical pleuroparenchymal scarring w/ bullous emphysema, thick-walled bullous lesion id left apex has a new S/F level & findings are otherw unchanged from 01/28/17, a chr infectious process is favored...  IMP/PLAN>>  Rajat remains asymtomatic w/o much cough/ sputum/ SOB/ chest discomfort/ etc;  He is not limited in his work/ daily life/ ADLs in any way and he is not inclined to pursue a more aggressive posture for additional evaluation of his pulm process=> we will continue conservative management- he will work on all smoking (pipe) cessation, continue the Advair/ Spiriva, continue exercise, etc... We plan rov in 3-16mo w/ CXR & fasting blood work...       NOTE:  >50% of this 30 min rov was spent in counseling & coordination of care.           Problem List:  ALLERGY (ICD-995.3) - uses OTC antihistamines Prn...  COPD (ICD-496) & EMPHYSEMATOUS BLEB (ICD-492.0) - on [place] daily... unfortunately he still smokes a pipe daily (w/ min cough, drainage, etc)... he has a neg FamHx of lung disease and a normal A1AT level (MM phenotype)... he is s/p VATS- RUL bullectomy in 1997 for infected bulla w/ A/F level. ~  baseline CXR's back to 1990 showed large  bilat bullae R>L & scarring at left base... ~  f/u CXR's w/ vol loss on right from the surg, bullae on the left, scarring, etc... ~  prev CT Chest revealed mult blebs & bullae in RUL & LUL to the lingular w/ scarring...  ~  1997= 14cm RUL bulla w/ AF level (required VATS Bullectomy- path= emphysema, infected bulla w/ acute/ chr inflamm). ~  CT Chest 10/09 in [place]= similar bullous dis on left w/ emphysema, post-op changes on right, NAD,etc. ~  PFT 6/00 showed FVC= 4.41 (84%), FEV1= 2.53 (59%), %1sec= 57, mid-flows= 25%pred... ~  PFT 6/06 showed FVC= 4.59 (87%), FEV1= 2.79 (65%), %1sec= 61, mid-flows= 27%pred... ~  CXR 5/11 showed advanced chr lung dis w/ hyperinflation, scarring, decr vol on right> NAD. ~  PFT 9/12 showed FVC= 4.07 (79%), FEV1= 2.34 (57%), %1sec= 57, mid-flows= 24%pred... c/w GOLD Stage2 COPD- MUST QUIT ALL SMOKING! ~  CXR 9/12 showed no signif change in his bullous lung dis & evid of prev surg on the right... Asked again to quit the pipe. 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 Must quit all smoking! 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 ~  Spirometry 02/17/15 showed FVC=3.25 (64%), FEV1=1.82 (46%), %1sec=56, mid-flows reduced at 21% predicted; c/w mod airflow obstruction & GOLD Stage3 COPD (can't r/o superimposed restriction w/o LVs)... ~  CXR 09/25/16>  Norm heart size, new pleuroparenchymal thickening & nodular density in left apex measuring about 2cm, increased interstitial markings bilat, emphysema & bullous changes bilat => proceed w/ CT Chest.  ABN CXR w/ LUL peripheral lesion found on routine CXR 09/25/16=> eval in progress...  Hx of CHEST PAIN, ATYPICAL (ICD-786.59) - on ASA 81mg /d... hx intermittent  chest wall tightness/ spasm/ ?palpit/ numb in left arm- last 2-3 min and resolves spont, not exercise induced... s/p cardiac eval 2/08 by DrCooper- he wanted to do treadmill and holter but pt never did these and states he  is doing fine w/o any incr in symptoms... ~  EKG 5/11 showed NSR 74/min, IVCD, NAD (no ch from old tracings) ~  EKG 9/12 showed NSR 68/min, IVCD, NAD & no change...  HYPERCHOLESTEROLEMIA, MILD (ICD-272.0) - on diet alone... ~  [place] 12/07 showed TChol 182, TG 80, HDL 36, LDL 130... he prefers diet Rx. ~  [place] 12/09 showed TChol 172, TG 81, HDL 36, LDL 120... rec- improve diet efforts, or consider statin. ~  FLP 5/11 showed TChol 169, TG 94, HDL 37, LDL 113... continue diet efforts. ~  FLP 9/12 showed TChol 172, TG 52, HDL 43, LDL 118 ~  FLP 10/13 on diet alone showed TChol 198, TG 99, HDL 47, LDL 131... Offered Crestor5mg /d, he will decide. ~  FLP 4/15 on diet alone showed TChol 174, TG 55, HDL 43, LDL 120... He prefers diet/ exercise & declined low dose statin rx. ~  FLP 7/16 on diet alone showed TChol 154, TG 79, HDL 41, LDL 97 ~  FLP 2/18 on diet alone showed TChol 164, TG 71, HDL 42, LDL 108  GERD (ICD-530.81) - on NEXIUM 40mg /d... he states he tried off PPI meds but had to restart due to signif heartburn... ~  9/12:  He reports that insurance co required switch off Nexium onto Protonix but the latter isn't working w/ return of severe reflux symptoms & he wants to restart the [place] 40mg /d... ~  10/13:  Doing satis back on the Nexium, but trouble w/ coverage thru his insurance... ~  4/15:  He continues stable on the Nexium40...  COLONIC POLYPS (ICD-211.3) & Family Hx of ADENOCARCINOMA, COLON (ICD-153.9)  - colonoscopy 7/01 by DrPatterson showed diminutive rectal polyp= hyperplastic... f/u planned 70yrs but pt cancelled and never rescheduled the procedure... f/u colonoscopy done 1/10 showed divertics & tiny polyp w/o adenomatous change (+FamHx of colon cancer in brother who died  of the disease at age 49)... Repeat due 1/15. ~  4/15:  He knows that he is due for f/u colonoscopy & will get this from a gastroenterologist in [facility] => he tells me this was done, we do not have record.  ADENOCARCINOMA, PROSTATE (ICD-185) >> ~  5/11:  when seen 12/09 his routine PSA was 5.98 & he was referred to [place] in [facility] where he lives for eval> +prostate cancer and s/p robotic prostatectomy 5/10 & doing well so far w/ PSA <0.01 on last check 11/10 w/ f/u due soon (Q34mo). ~  He had episode of micturition syncope that occurred one night- awoke w/ full bladder & was standing to urinate, then fell w/ transient syncope, injured tooth & fx rib; recovered uneventfully & he is asked to sit down to empty bladder, stand slowly & be careful> no recurrence. ~  9/12:  He tells me that [place] "released me" & he wants [name] to do yearly PSA f/u here; PSA today = zero... ~  10/13:  Clinically stable w/o voiding difficulty, incont, etc; PSA= 0.00 ~  4/15:  He remains clinically stable w/o complaints and f/u PSA remains zero... ~  7/16:  PSA remains 0.00, he is asking about Cialis for daily use => given Rx.  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  ANXIETY (ICD-300.00)  Health Maintenance >> ~  GI:  His brother died age 4 w/ colon cancer; pt had hyperplastic polyp in past; last colonoscopy was 1/10 by DrPatterson- neg, f/u planned 8yrs. ~  GU:  Hx prostate cancer Dx  2010 w/ Bx DrChapman in [place]; s/p robotic surg 5/10 & doing well since then; PSA= zero ~  Immuniz:  He had PNEUMOVAX in 1999, and gets yearly flu shots... Pneumovax f/u vaccination given 12/09... Given TDAP 5/11   Past Surgical History:  Procedure Laterality Date  . CHOLECYSTECTOMY  2004  . OTHER SURGICAL HISTORY     S/P VATS w/ infected bleb removed from RUL 1997  . PROSTATECTOMY  11/2008   S/P robotic prostatectomy in [facility]    Outpatient Encounter Medications as of 06/09/2017    Medication Sig  . aspirin 325 MG tablet Take 325 mg by mouth daily.    Marland Kitchen azithromycin (ZITHROMAX) 250 MG tablet Take as directed  . esomeprazole (NEXIUM) 40 MG capsule Take 1 capsule (40 mg total) by mouth daily at 12 noon.  . Fluticasone-Salmeterol (ADVAIR DISKUS) 250-50 MCG/DOSE AEPB Use 1 inhalation orally  twice daily  . Multiple Vitamin (MULTIVITAMIN) tablet Take 1 tablet by mouth daily.    . tadalafil (CIALIS) 5 MG tablet TAKE 1 TABLET BY MOUTH  DAILY  . tiotropium (SPIRIVA HANDIHALER) 18 MCG inhalation capsule INHALE THE CONTENTS OF 1  CAPSULE VIA HANDIHALER  DAILY   No facility-administered encounter medications on file as of 06/09/2017.     No Known Allergies    Immunization History  Administered Date(s) Administered  . H1N1 07/01/2008  . Influenza Split 04/26/2011, 05/01/2012  . Influenza,inj,Quad PF,6+ Mos 08/17/2015, 08/21/2016, 06/09/2017  . Pneumococcal Conjugate-13 02/17/2015  . Pneumococcal Polysaccharide-23 07/01/2008  . Td 12/22/2009  . Tdap 04/26/2011    Current Medications, Allergies, Past Medical History, Past Surgical History, Family History, and Social History were reviewed in [employer] record.     Review of Systems        Hosie is remarkably devoid of symptoms- CC is nasal congestion and drainage, min cough w/ clear sput.  The patient denies fever, chills, sweats, anorexia, fatigue, weakness, malaise, weight loss, sleep disorder, blurring, diplopia, eye irritation, eye discharge, vision loss, eye pain, photophobia, earache, ear discharge, tinnitus, decreased hearing, nasal congestion, nosebleeds, sore throat, hoarseness, chest pain, palpitations, syncope, orthopnea, PND, peripheral edema, dyspnea at rest, excessive sputum, hemoptysis, wheezing, pleurisy, nausea, vomiting, diarrhea, constipation, change in bowel habits, abdominal pain, melena, hematochezia, jaundice, gas/bloating, indigestion/heartburn, dysphagia, odynophagia,  dysuria, hematuria, urinary frequency, urinary hesitancy, nocturia, incontinence, back pain, joint pain, joint swelling, muscle cramps, muscle weakness, stiffness, arthritis, sciatica, restless legs, leg pain at night, leg pain with exertion, rash, itching, dryness, suspicious lesions, paralysis, paresthesias, seizures, tremors, vertigo, transient blindness, frequent falls, frequent headaches, difficulty walking, depression, anxiety, memory loss, confusion, cold intolerance, heat intolerance, polydipsia, polyphagia, polyuria, unusual weight change, abnormal bruising, bleeding, enlarged lymph nodes, urticaria, allergic rash, hay fever, and recurrent infections.     Objective:   Physical Exam    WD, WN, 61 y/o WM in NAD... GENERAL:  Alert & oriented; pleasant & cooperative... HEENT:  [place]/AT, EOM-wnl, PERRLA, Fundi-benign, EACs-clear, TMs-wnl, NOSE-clear, THROAT-clear & wnl. NECK:  Supple w/ fairROM; no JVD; normal carotid impulses w/o bruits; no thyromegaly or nodules palpated; no lymphadenopathy. CHEST:  decr BS bilat, clear to P & A; without wheezes/ rales/ or rhonchi heard... HEART:  Regular Rhythm; without murmurs/ rubs/ or gallops. ABDOMEN:  Soft & nontender; normal bowel sounds; no organomegaly or masses detected. EXT: without deformities or arthritic changes; no varicose veins/ venous insuffic/ or edema. NEURO:  CN's intact; motor testing normal; sensory testing normal; gait normal & balance OK. DERM:  No  lesions noted; no rash etc...  RADIOLOGY DATA:  Reviewed in the EPIC EMR & discussed w/ the patient...  LABORATORY DATA:  Reviewed in the EPIC EMR & discussed w/ the patient...   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 Continue [place], Mucinex as needed, etc...  NEW CXR ABNORMALITY 2/18 w/ LUL peripheral  abnormality that occurred in the wake of a LUL pneumonia from Oct2017- treated w/ Levaquin in [place];  He has had CT, LABS, PET => see above & decision made to continue close follow up w/ serial CXR/ scans...  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 I presented case to DrNestor who agreed w/ the conservative approach at this point in light of no clear cut signs of worsening parenchymal dis...  06/09/17>   Briyan remains asymtomatic w/o much cough/ sputum/ SOB/ chest discomfort/ etc;  He is not limited in his work/ daily life/ ADLs in any way and he is not inclined to pursue a more aggressive posture for additional evaluation of his pulm process=> we will continue conservative management- he will work on all smoking (pipe) cessation, continue the Advair/ Spiriva, continue exercise, etc... We plan rov in 3-85mo w/ CXR & fasting blood work.   Hx AtypCP>  No further chest discomfort; EKG w/ NSR, IVCD, NAD...  Borderline CHOL>  LDL not at goal but he is not inclined to take statin Rx; therefore diet alone...  GERD>  He has signif symptoms and NOT improved w/ Protonix; NEXIUM is the only PPI that works for him & we will re-write this med...  Colon Polyps>  As noted brother died age 50 w/ colon cancer; last colonoscopy 1/10 was ok & f/u due now- he will get this in [facility]...  PROSTATE CANCER> DrChapman in [place] has released him & asked that we do yearly PSA; todays lab w/ PSA= zero... NOTE: hx one episode of micturition syncope w/o recurrence...  DJD>  Stable, uses  OTC analgesics as needed...  Anxiety>  Aware, stable, no meds required...     Medication List        Accurate as of 06/09/17  5:02 PM. Always use your most recent med list.          aspirin 325 MG tablet   azithromycin 250 MG tablet Commonly known as:  ZITHROMAX Take as directed   esomeprazole 40 MG capsule Commonly known as:  NEXIUM Take 1 capsule (40 mg total) by mouth daily at 12 noon.   Fluticasone-Salmeterol 250-50 MCG/DOSE Aepb Commonly known as:  ADVAIR DISKUS Use 1 inhalation orally  twice daily   multivitamin tablet   tadalafil 5 MG tablet Commonly known as:  CIALIS TAKE 1 TABLET BY MOUTH  DAILY   tiotropium 18 MCG inhalation capsule Commonly known as:  SPIRIVA HANDIHALER INHALE THE CONTENTS OF 1  CAPSULE VIA HANDIHALER  DAILY

## 2017-06-09 NOTE — Patient Instructions (Signed)
Today we updated your med list in our EPIC system...    Continue your current medications the same...  We reviewed your CT Chest from earlier today...    I will confer w/ the radiologists and call you with their opinions...  Continue the Advair twice daily, Spiriva once daily, and the ZPak as needed...  Call for any questions...  Let's plan a follow up in 3-4 months.Marland KitchenMarland Kitchen

## 2017-08-23 ENCOUNTER — Other Ambulatory Visit: Payer: Self-pay | Admitting: Pulmonary Disease

## 2017-08-28 ENCOUNTER — Other Ambulatory Visit: Payer: Self-pay | Admitting: Pulmonary Disease

## 2017-09-04 ENCOUNTER — Ambulatory Visit: Payer: 59 | Admitting: Pulmonary Disease

## 2017-09-10 ENCOUNTER — Encounter: Payer: Self-pay | Admitting: Pulmonary Disease

## 2017-09-10 ENCOUNTER — Ambulatory Visit (INDEPENDENT_AMBULATORY_CARE_PROVIDER_SITE_OTHER)
Admission: RE | Admit: 2017-09-10 | Discharge: 2017-09-10 | Disposition: A | Discharge status: Home / Self Care | Payer: 59 | Source: Ambulatory Visit | Attending: Pulmonary Disease | Admitting: Pulmonary Disease

## 2017-09-10 ENCOUNTER — Ambulatory Visit (INDEPENDENT_AMBULATORY_CARE_PROVIDER_SITE_OTHER): Payer: 59 | Admitting: Pulmonary Disease

## 2017-09-10 ENCOUNTER — Other Ambulatory Visit (INDEPENDENT_AMBULATORY_CARE_PROVIDER_SITE_OTHER): Payer: 59

## 2017-09-10 VITALS — BP 130/74 | HR 58 | Ht 73.0 in | Wt 180.2 lb

## 2017-09-10 DIAGNOSIS — E7800 Pure hypercholesterolemia, unspecified: Secondary | ICD-10-CM

## 2017-09-10 DIAGNOSIS — J432 Centrilobular emphysema: Secondary | ICD-10-CM | POA: Diagnosis not present

## 2017-09-10 DIAGNOSIS — C61 Malignant neoplasm of prostate: Secondary | ICD-10-CM | POA: Diagnosis not present

## 2017-09-10 DIAGNOSIS — F17290 Nicotine dependence, other tobacco product, uncomplicated: Secondary | ICD-10-CM

## 2017-09-10 DIAGNOSIS — Z0000 Encounter for general adult medical examination without abnormal findings: Secondary | ICD-10-CM

## 2017-09-10 DIAGNOSIS — J209 Acute bronchitis, unspecified: Secondary | ICD-10-CM | POA: Diagnosis not present

## 2017-09-10 DIAGNOSIS — R9389 Abnormal findings on diagnostic imaging of other specified body structures: Secondary | ICD-10-CM | POA: Diagnosis not present

## 2017-09-10 DIAGNOSIS — F419 Anxiety disorder, unspecified: Secondary | ICD-10-CM | POA: Diagnosis not present

## 2017-09-10 DIAGNOSIS — J439 Emphysema, unspecified: Secondary | ICD-10-CM

## 2017-09-10 DIAGNOSIS — D126 Benign neoplasm of colon, unspecified: Secondary | ICD-10-CM | POA: Diagnosis not present

## 2017-09-10 DIAGNOSIS — K219 Gastro-esophageal reflux disease without esophagitis: Secondary | ICD-10-CM | POA: Diagnosis not present

## 2017-09-10 LAB — COMPREHENSIVE METABOLIC PANEL
ALT: 12 U/L (ref 0–53)
AST: 15 U/L (ref 0–37)
Albumin: 3.9 g/dL (ref 3.5–5.2)
Alkaline Phosphatase: 87 U/L (ref 39–117)
BILIRUBIN TOTAL: 0.5 mg/dL (ref 0.2–1.2)
BUN: 14 mg/dL (ref 6–23)
CALCIUM: 9.1 mg/dL (ref 8.4–10.5)
CO2: 30 meq/L (ref 19–32)
Chloride: 104 mEq/L (ref 96–112)
Creatinine, Ser: 0.8 mg/dL (ref 0.40–1.50)
GFR: 104.59 mL/min (ref >60.00)
GLUCOSE: 93 mg/dL (ref 70–99)
POTASSIUM: 4.2 meq/L (ref 3.5–5.1)
Sodium: 140 mEq/L (ref 135–145)
Total Protein: 6.7 g/dL (ref 6.0–8.3)

## 2017-09-10 LAB — LIPID PANEL
CHOL/HDL RATIO: 4
Cholesterol: 148 mg/dL (ref 0–200)
HDL: 36.5 mg/dL — ABNORMAL LOW (ref >39.00)
LDL CALC: 94 mg/dL (ref 0–99)
NONHDL: 111.23
Triglycerides: 86 mg/dL (ref 0.0–149.0)
VLDL: 17.2 mg/dL (ref 0.0–40.0)

## 2017-09-10 LAB — CBC WITH DIFFERENTIAL/PLATELET
Basophils Absolute: 0.1 10*3/uL (ref 0.0–0.1)
Basophils Relative: 0.7 % (ref 0.0–3.0)
EOS PCT: 2.2 % (ref 0.0–5.0)
Eosinophils Absolute: 0.2 10*3/uL (ref 0.0–0.7)
HEMATOCRIT: 40.7 % (ref 39.0–52.0)
HEMOGLOBIN: 13.9 g/dL (ref 13.0–17.0)
Lymphocytes Relative: 25 % (ref 12.0–46.0)
Lymphs Abs: 2 10*3/uL (ref 0.7–4.0)
MCHC: 34.1 g/dL (ref 30.0–36.0)
MCV: 88 fl (ref 78.0–100.0)
MONO ABS: 0.6 10*3/uL (ref 0.1–1.0)
MONOS PCT: 7 % (ref 3.0–12.0)
Neutro Abs: 5.3 10*3/uL (ref 1.4–7.7)
Neutrophils Relative %: 65.1 % (ref 43.0–77.0)
Platelets: 335 10*3/uL (ref 150.0–400.0)
RBC: 4.63 Mil/uL (ref 4.22–5.81)
RDW: 13.8 % (ref 11.5–15.5)
WBC: 8.2 10*3/uL (ref 4.0–10.5)

## 2017-09-10 LAB — PSA: PSA: 0 ng/mL — ABNORMAL LOW (ref 0.10–4.00)

## 2017-09-10 LAB — TSH: TSH: 1.38 u[IU]/mL (ref 0.35–4.50)

## 2017-09-10 LAB — SEDIMENTATION RATE: SED RATE: 12 mm/h (ref 0–20)

## 2017-09-10 MED ORDER — HYDROCODONE-HOMATROPINE 5-1.5 MG/5ML PO SYRP
5.0000 mL | ORAL_SOLUTION | Freq: Four times a day (QID) | ORAL | 0 refills | Status: AC | PRN
Start: 2017-09-10 — End: ?

## 2017-09-10 MED ORDER — PREDNISONE 5 MG (21) PO TBPK: ORAL_TABLET | ORAL | 0 refills | Status: DC

## 2017-09-10 MED ORDER — AZITHROMYCIN 250 MG PO TABS: ORAL_TABLET | ORAL | 0 refills | Status: DC

## 2017-09-10 NOTE — Progress Notes (Signed)
Subjective:    Patient ID: Fred [name], male    DOB: 12-10-1955, 62 y.o.   MRN: [id number]  HPI 62 y/o WM here for a follow up visit... he has multiple medical problems as noted below...  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 ~  SEE PREV EPIC NOTES FOR OLDER DATA >>   ~  May 01, 2102:  [name] CPX> [name] has had a good year, no new complaints or concerns except his insurance co difficulty w/ Nexium64m/d & he will check w/ [name] & 30d vs 90d via his [employer]...    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 CXR is unchanged...    Chol> on diet alone; he had recent business trip & is worried his diet wasn't what it should have been; FLP showed TChol 198, TG 99, HDL 47, LDL 131    GERD> on Nexium40; Protonix wasn't helpful & he is hopeful that his insurance co will cover the needed Nexium...    Colon Polyp & +FamHx colon ca> last colon 102/03/24w/ divertics & tiny polyp, f/u due 1/15 and he remains asymptomatic...    Prostate cancer> s/p robotic [place] 5[place]10 [place]; Labs today showed PSA= 0.00 We reviewed prob list, meds, xrays and labs> see below for updates >> OK 2013 Flu vaccine today...  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  LABS 10/13:  FLP- ok x LDL=131 on diet alone;  Chems- wnl;  CBC- wnl;  TSH=0.79;  PSA=0.00;  Urine- clear...  ~  November 03, 2013:  111moOV & [name] reports a good interval w/o new complaints or concerns; getting regular exercise (retired) and feeling well- he notes some troublesome sinus  drainage and recent "cold" for which we called in a Pred dosepak but he notes min change; we discussed Rx w/ OTC Antihist, Nasal saline mist Q1-2h, Mucinex- 2Bid, Fluticasone 2sp each nostril Qhs, and Rx for Astelin- 2sp each nostril Bid as needed; he still smokes a pipe regularly & again asked to quit! [facility]... We reviewed the following medical problems during today's office visit >>     COPD, Emphysematous bleb, s/p RUL bullectomy 1997 for infected bleb w/ A/F level, pipe smoker> on Advair250, Spiriva; no intercurrent infections, min cough & sput, no hemoptysis, stable DOE w/o change, no edema, etc; he reports cut back on pipe smoking to ~50% of former level & asked to decr further & work on smoking cessation! CXR is stable, unchanged...    Chol> on diet alone & his weight is good; FLP 4/15 showed TChol 174, TG 55, HDL 43, LDL 120; he does not want meds despite not being optimal, reviewed diet...    GERD> on Nexium40; Protonix wasn't helpful; he denies adb pain, dysphagia, n/v, c/d, blood seen...     Colon Polyp & +FamHx colon ca (bro died at 52)> last colon 07/2022-02-03/ divertics & tiny polyp, f/u due 1/15 and he remains asymptomatic (he will get this  in [facility])...    Prostate cancer> s/p robotic prostatectomy 5/10 & DrChapman in [place] released [place] 9/12 & asked [name] to monitor PSA yearly; Labs today showed PSA= 0.00 We reviewed prob list, meds, xrays and labs> see below for updates >> he did not get the 2014 Flu vaccine last fall & reminded to get this every yr at local pharm...  CXR 4/15 showed norm heart size, vol loss on right w/ pleuroparenchymal scarring, hyperinflation on left w/ bullous emphysema, stable- NAD...  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;  He reports breathing is doing OK & he had one infectious exac 2/16 (we called in meds); unfortunately he is still smoking a pipe "I enjoy it" but less tobacco than before he says; in addition he has been using his Spiriva "prn" as he freq forgets this dose-- we had a productive conversation about his bullous emphysema/ prev surg/ etc, I indicated that while I am delighted that he remains relatively asymptomatic at this time, that he is more rapidly approaching a life of deteriorating resp function w/ daily symptoms/ chronic resp failure/ need for oxygen 24/7, etc; he understands this & I have made yet another pitch for complete smoking cessation & taking his meds regularly day-in and day-out...     COPD, Emphysematous bleb, s/p RUL bullectomy 1997 for infected bleb w/ A/F level, pipe smoker> on Advair250Bid, Spiriva daily; no intercurrent infections, min cough & sput, no hemoptysis, stable DOE w/o change, no edema, etc; he reports cut back on pipe smoking but must quit completely as above; CXR is stable, unchanged w/ chr changes on right & bullous dis on left...    Chol> on diet alone & his weight is good; FLP 7/16 showed TChol 154, TG 79, HDL 41, LDL 97 => improved, keep up the good work!    GERD> on Nexium40; Protonix wasn't helpful; he denies abd pain, dysphagia, n/v, c/d, blood seen; he tried off the Nexium- symptoms returned therefore back on the PPI daily...    Colon Polyp & +FamHx colon ca (bro died at 52)> last colon 119-Jan-2024w/ divertics & tiny polyp, f/u done in [place] in 2015, he reports neg & [place]    Prostate cancer> s/p robotic prostatectomy 5/10 [place] asked [place] yearly; Labs today showed PSA= 0.00, requests Cialis for daily use. We reviewed prob list, meds, xrays and labs> see below for updates >> Given PREVNAR-13 today...  CXR  02/17/15 showed norm heart size, severe COPD w/ bullous changes on left & s/p surg/ scarring on right, NAD/ no change from 2015...  Spirometry 02/17/15 showed FVC=3.25 (64%), FEV1=1.82 (46%), %1sec=56, mid-flows reduced at 21% predicted; c/w mod airflow obstruction & GOLD Stage3 COPD (can't r/o superimposed restriction w/o LVs)...  LABS 02/17/15:  FLP- all parameters at goals on diet;  Chems- wnl;  CBC- wnl;  TSH=1.10;  PSA= 0.00    IMP/PLAN>>  PFT shows functional deterioration from last studies, now GOLD Stage3, CXR unchanged, Labs OK;  He knows the score & must quit all smoking, take meds every day, try  to avoid infections, increase exercise program..    ~  September 25, 2016:  54mo ROV & CPX>  Tyjuan reports doing well & only recalls one resp exac since last visit- records in Epic/ CareEverywhere reviewed>>    He called here 03/21/16 c/o nasal congestion, chest tight, cough w/ clear mucus;  We called in Pred dosepak, Mucinex, nasal saline...    He called back 8/31 c/o persistent cough (now nonproductive) chest tight/ SOB, denied f/c/s;  Asked to go to PCP vs UC for exam & to be checked...    He saw PCP 8/31 at [facility], [place], [facility]> c/o cough & cold-like sx for 1wk, T101.5, CXR showed LUL pneumonia, given IM-Rocephin, Levaquin500 x10d, Tussionex...    He was f/u PCP-Christensen on 9/5> c/o persist fever/ cough/ congestion but feeling sl better; Temp 100.8 &he was given Rocephin IM, continue Levaquin...    He was rechecked 9/7> clinically improved, T98.9, given 10 more days of Levaquin500...    Pt returned 10/13> c/o 1wk hx recurrent cough, sputum, congestion & Dx w/ bronchitis=> given another 10d Levaquin500, no f/u CXR done. Currently he notes a mild "cold" w/ min cough, no sput, no hemoptysis, no f/c/s, & stable SOB/DOE esp if going uphill, pushing wheelbarrow, etc... 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 we reviewed the following medical problems during today's office visit >>     COPD, Emphysematous bleb, s/p RUL bullectomy 1997 for infected bleb w/ A/F level, pipe smoker> on Advair250Bid, Spiriva daily; min cough & sput, no hemoptysis, stable DOE w/o change, no edema, etc; he reports cut back on pipe smoking but must quit completely as above; CXR was stable x yrs w/ chr changes on right & bullous dis on left; then f/u CXR 09/25/16 showed new LUL lesion => CT pending.    New LUL peripheral lesion/ CXR abnormality> found on routine CXR 09/25/16 => work-up in progress w/ CT Chest pending...    Chol> on diet alone & his weight is good; FLP 2/18 showed TChol 164, TG 71, HDL 42, LDL 108 => stable & doing satis on diet...    GERD> on Nexium40; Protonix wasn't helpful; he denies abd pain, dysphagia, n/v, c/d, blood seen; he tried off the Nexium- symptoms returned therefore back on the PPI daily...    Colon Polyp & +FamHx colon ca (bro died at 52)> colon 08-21-2022 w/ divertics & tiny polyp, f/u done in [place] in 2015, he reports neg & will get copies sent to [name]     Prostate cancer> s/p robotic prostatectomy 5/10 & DrChapman in [place] released him 9/12 & asked [name] to monitor PSA yearly; Labs today showed PSA= 0.00, requests Cialis for daily use. EXAM shows Afeb, VSS, O2sat=99% on RA;  HEENT- neg, mallampati1;  Chest- decr BS but clear w/o w/r/r;  Heart- RR gr1/6 SEM w/o r/g;  Abd- soft, nontender, neg;  Ext- neg w/o c/c/e;  Neuro- intact w/o focal abn...  CXR 09/25/16>  Norm heart size, new pleuroparenchymal thickening & nodular density in left apex measuring about 2cm, increased interstitial markings bilat, emphysema & bullous changes bilat...  EKG 09/25/16>  NSR, rate62, IVCD, NAD...  LABS 09/25/16>  FLP- ok w/ LDL=108 on diet alone;  Chems- wnl;  CBC- wnl;  TSH=1.27;  PSA=0.00    IMP/PLAN>>  Toshiro has a new abn on his f/u CXR in the left apex=> needs CT Chest ASAP for further  eval & we will  sched;  In the meanwhile we (again) reviewed the need to quit all smoking (he still smokes a pipe) and take meds regularly- Advair250Bid & Spiriva daily;  He needs to incr his exercise in a gradual exercise program as well;  We gave him the 2017 FLU vaccine today & a prescription for a ZPak to keep on hand.  ADDENDUM>>  CT CHEST w/ contrast 10/02/16 showed norm heart size, atherosclerosis Ao & L-Circ, norm caliber PAs & no PE seen; mod centrilob & paraseptal emhysema w/ bullous changes bilat & s/p RUL wedge resection w/ post surg scarring; thick walled irreg 6.5 x 3.7 cm cavitary lesion in LUL apical-post;  2 small nodules 4-28mm size in lingula and RML noted; two 1.0-1.2cm nodes (L mediastinal & R hilar); s/pGB & 2cm R-renal cyst... NOTE: we discussed CT results and options=> decided to proceed w/ PET scan...  ADDENDUM>>  PET scan 10/23/16 showed a cavitary left apical process w/ nodularity along its inferior border tracking along the pleural surface (similar appearance to the recent CT Chest)- max assoc SUV is along this inferior pleural nodularity w/ max SUV=5.8;  Underlying emphysema, calcif biapical scarring R>L;  approx 6 x 2 cm periph density LLL posteromedially w/ max SUV=2.2 & likely benign;  Right hilar adenopathy is faintly hypermet w/ max SUV=3.0;  Incidental focal metabolic actiivity along the cecum w/ SUV max=9.7 (tumor cannot be excluded, no adenopathy apprec in this area)=> we will refer to his GI...  I reviewed these scans w/ Radiology & their IR team=> they felt that needle bx LUL could be done but carried a signif risk of pneumothorax & they suggested watchful waiting w/ repeat CT Chest in 3-4 months;  I called Pt & wife to review all this & they are in agreement w/ conservative approach & repeat scan in [id number] is planned;  They know to call me for any problems, Eason will call his gastroenterologist for OV to discuss PET scan & address ?need for repeat colon at this time...    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 He remains on ADVAIR250Bid & SPIRIVA once daily, uses Mucinex prn, and has had ZPak & Cipro given in the interim;  He still smokes the pipe- "I'm cutting back every day" & I implored him to quit completely... we reviewed the following medical problems during today's office visit >>     COPD, Emphysematous blebs, s/p RUL bullectomy 1997 for infected bleb w/ A/F level, pipe smoker> on Advair250Bid, Spiriva daily; min cough & sput, no hemoptysis, stable DOE w/o change, no edema, etc; he reports cut back on pipe smoking but must quit completely as above; CXR was stable x yrs w/ chr changes on right & bullous dis on left; then he developed LUL pneumonia Fall2017 treated in [place] w/ Levaquin, & f/u CXR here 09/25/16 showed new LUL  abnormality => CT Chest, subseq PET scan, and serial CT scans as documented...    Hx LUL pneumonia [id number] w/ residual LUL parenchymal abnormality> clinically pt is feeling & breathing well, at baseline, w/o much cough/ sputum/ etc; we considered bronch & lavage- following serial scans for now...    Chol> on diet alone & his weight is good; FLP 2/18 showed TChol 164, TG 71, HDL 42, LDL 108 => stable & doing satis on diet...    GERD> on Nexium40; Protonix wasn't helpful; he denies abd pain, dysphagia, n/v, c/d, blood seen; he tried off the Nexium- symptoms returned therefore back on the PPI daily...    Colon Polyp & +FamHx colon ca  (bro died at 52)> colon 09/05/22 w/ divertics & tiny polyp, f/u done in [place] in 2015, he reports neg & will get copies sent to [name]; PET scan 09/2016 for lung abn showed focal metabolic activ along the cecum (SUV 9.7) & he had f/u w/ DrSingh in [place] w/ repeat colon showing 1 sm polyp, otherw neg...    Prostate cancer> s/p robotic prostatectomy 5/10 & DrChapman in [place] released him 9/12 & asked [name] to monitor PSA yearly; Labs 2/18 showed PSA= 0.00, requests Cialis for daily use. EXAM shows Afeb, VSS, O2sat=98% on RA;  HEENT- neg, mallampati1;  Chest- decr BS but clear w/o w/r/r;  Heart- RR gr1/6 SEM w/o r/g;  Abd- soft, nontender, neg;  Ext- neg w/o c/c/e;  Neuro- intact w/o focal abn...  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 I presented case to DrNestor who agreed w/ the conservative approach at this point in light of no clear cut signs of worsening parenchymal dis... We will recheck pt in Nov w/ another CT Chest...  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 He is takingAdvair250Bid, Spiriva once daily, & he has a Zpak for prn use if nec...     Problem List as above- reviewed w/ pt>>  EXAM shows Afeb, VSS, O2sat=97% on RA;  HEENT- neg, mallampati1;  Chest- decr BS but clear w/o w/r/r;  Heart- RR gr1/6 SEM w/o r/g;  Abd- soft, nontender, neg;  Ext- neg w/o c/c/e;  Neuro- intact w/o focal abn...  CT Chest 06/09/17 (independently reviewed by me in the PACS system) shows norm heart size w/ Ao & coronary atherosclerotic  calcif; no adenopathy, biapical pleuroparenchymal scarring w/ bullous emphysema, thick-walled bullous lesion in left apex has a new A/F level & findings are otherw unchanged from 01/28/17, a chr infectious process is favored...  IMP/PLAN>>  Rorik remains asymtomatic w/o much cough/ sputum/ SOB/ chest discomfort/ etc;  He is not limited in his work/ daily life/ ADLs in any way and he is not inclined to pursue a more aggressive posture for additional evaluation of his pulm process=> we will continue conservative management- he will work on all smoking (pipe) cessation, continue the Advair/ Spiriva, continue exercise, etc... We plan rov in 3-88mo w/ CXR & fasting blood work...        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 No CP/ palpit/ edema; NOTE- he is still smoking a pipe despite all efforts to get him to quit...  We reviewed the following medical problems during today's office  visit>      COPD, Emphysematous blebs, s/p RUL bullectomy 1997 for infected bleb w/ A/F level> on Advair250Bid, Spiriva daily; min cough & sput, no hemoptysis, stable DOE w/o change, no edema, etc; he reports cut back on pipe smoking but must quit completely as above; CXR was stable x yrs w/ chr changes on right & bullous dis on left; then he developed LUL pneumonia Fall2017 treated in [place] w/ Levaquin, & f/u CXR here 09/25/16 showed new LUL abnormality => CT Chest, subseq PET scan, and serial CT scans as documented...    Hx LUL pneumonia [id number] w/ residual LUL parenchymal abnormality> clinically pt is feeling & breathing well, at baseline, w/o much cough/ sputum/ etc; we considered bronch & lavage- following serial scans for now...    PIPE SMOKER>      Chol> on diet alone & his weight is good; FLP 2/18 showed TChol 164, TG 71, HDL 42, LDL 108 => stable & doing satis on diet...    GERD> on Nexium40; Protonix wasn't helpful; he denies abd pain, dysphagia, n/v, c/d, blood seen; he tried off the Nexium- symptoms returned therefore back on the PPI daily...    Colon Polyp & +FamHx colon ca (bro died at 52)> colon August 13, 2022 w/ divertics & tiny polyp, f/u done in [place] in 2015, he reports neg & will get copies sent to [name]; PET scan 09/2016 for lung abn showed focal metabolic activ along the cecum (SUV 9.7) & he had f/u w/ DrSingh in [place] w/ repeat colon showing 1 sm polyp, otherw neg...    Prostate cancer> s/p robotic prostatectomy 5/10 & DrChapman in [place] released him 9/12 & asked [name] to monitor PSA yearly; Labs 2/18 showed PSA= 0.00, requests Cialis for daily use. EXAM shows Afeb, VSS, O2sat=97% on RA;  HEENT- neg, mallampati1;  Chest- decr BS but clear w/o w/r/r;  Heart- RR gr1/6 SEM w/o r/g;  Abd- soft, nontender, neg;  Ext- neg w/o c/c/e;  Neuro- intact w/o focal abn...  CXR 09/10/17 (independently reviewed by me in the PACS system) showed norm heart size, COPD/ emphysema w/ biapical bullous formation L>R  w/ air-fluid level in bleb...  LABS 09/10/17>  FLP- ok on diet alone;  Chems- wnl;  CBC- wnl;  TSH=1.38;  PSA=0.00;  Sed=12  IMP/PLANS>>  We decided to treat Juanda Crumble' symptoms w/ ZPak/ Pred dosepak/ Hycodan and he will try to decr & quit the pipe smoking!!! LUL changes continue to evolve & I am concerned that he has an air-fluid level that could get infected it's self (but the norm CBC & Sed rate are reassuring); we will continue to monitor carefully & plan f/u CT Chest [id number]...            Problem List:  ALLERGY (ICD-995.3) - uses OTC antihistamines Prn...  COPD (ICD-496) & EMPHYSEMATOUS BLEB (ICD-492.0) - on Jarrell daily... unfortunately he still smokes a pipe daily (w/ min cough, drainage, etc)... he has a neg FamHx of lung disease and a normal A1AT level (MM phenotype)... he is s/p VATS- RUL bullectomy in 1997 for infected bulla w/ A/F level. ~  baseline CXR's back to 1990 showed large bilat bullae R>L & scarring at left base... ~  f/u CXR's w/ vol loss on right from the surg, bullae on the left, scarring, etc... ~  prev CT Chest revealed mult blebs & bullae in RUL & LUL to the lingular w/ scarring...  ~  1997= 14cm RUL bulla w/ AF level (required VATS Bullectomy- path= emphysema, infected bulla w/ acute/ chr inflamm). ~  CT Chest 10/09 in [place]= similar bullous dis on left w/ emphysema, post-op changes on right, NAD,etc. ~  PFT 6/00 showed FVC= 4.41 (84%), FEV1= 2.53 (59%), %1sec= 57, mid-flows= 25%pred... ~  PFT 6/06 showed FVC= 4.59 (87%), FEV1= 2.79 (65%), %1sec= 61, mid-flows= 27%pred... ~  CXR 5/11 showed advanced chr lung dis w/ hyperinflation, scarring, decr vol on right> NAD. ~  PFT 9/12 showed FVC= 4.07 (79%), FEV1= 2.34 (57%), %1sec= 57, mid-flows= 24%pred... c/w GOLD Stage2 COPD- MUST QUIT ALL SMOKING! ~  CXR 9/12 showed no signif change in his bullous lung dis & evid of prev surg on the right... Asked again to quit the pipe. ~  CXR 10/13 showed normal heart  size, chronic bullous lung dis on left, NAD... Must quit all smoking! 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 ~  Spirometry 02/17/15 showed FVC=3.25 (64%), FEV1=1.82 (46%), %1sec=56, mid-flows reduced at 21% predicted; c/w mod airflow obstruction & GOLD Stage3 COPD (can't r/o superimposed restriction w/o LVs)... ~  CXR 09/25/16>  Norm heart size, new pleuroparenchymal thickening & nodular density in left apex measuring about 2cm, increased interstitial markings bilat, emphysema & bullous changes bilat => proceed w/ CT Chest.  ABN CXR w/ LUL peripheral lesion found on routine CXR 09/25/16=> eval in progress... ~  He had LUL pneumonia [facility] by [facility]CP in [facility] during the Fall[facility] but this was not followed up by them...   Hx of CHEST PAIN, ATYPICAL (ICD-786.59) - on ASA 81mg /d... hx intermittent chest wall tightness/ spasm/ ?palpit/ numb in left arm- last 2-3 min and resolves spont, not exercise induced... s/p cardiac eval 2/08 by DrCooper- he wanted to do treadmill and holter but pt never did these and states he is doing fine w/o any incr in symptoms... ~  EKG 5/11 showed NSR 74/min, IVCD, NAD (no ch from old tracings) ~  EKG 9/12 showed NSR 68/min, IVCD, NAD & no change...  HYPERCHOLESTEROLEMIA, MILD (ICD-272.0) -  on diet alone... ~  [place] 12/07 showed TChol 182, TG 80, HDL 36, LDL 130... he prefers diet Rx. ~  [place] 12/09 showed TChol 172, TG 81, HDL 36, LDL 120... rec- improve diet efforts, or consider statin. ~  FLP 5/11 showed TChol 169, TG 94, HDL 37, LDL 113... continue diet efforts. ~  FLP 9/12 showed TChol 172, TG 52, HDL 43, LDL 118 ~  FLP 10/13 on diet alone showed TChol 198, TG 99, HDL 47, LDL 131... Offered Crestor5mg /d, he will decide. ~  FLP 4/15 on diet alone  showed TChol 174, TG 55, HDL 43, LDL 120... He prefers diet/ exercise & declined low dose statin rx. ~  FLP 7/16 on diet alone showed TChol 154, TG 79, HDL 41, LDL 97 ~  FLP 2/18 on diet alone showed TChol 164, TG 71, HDL 42, LDL 108  GERD (ICD-530.81) - on NEXIUM 40mg /d... he states he tried off PPI meds but had to restart due to signif heartburn... ~  9/12:  He reports that insurance co required switch off Nexium onto Protonix but the latter isn't working w/ return of severe reflux symptoms & he wants to restart the [place] 40mg /d... ~  10/13:  Doing satis back on the Nexium, but trouble w/ coverage thru his insurance... ~  4/15:  He continues stable on the Nexium40...  COLONIC POLYPS (ICD-211.3) & Family Hx of ADENOCARCINOMA, COLON (ICD-153.9)  - colonoscopy 7/01 by DrPatterson showed diminutive rectal polyp= hyperplastic... f/u planned 38yrs but pt cancelled and never rescheduled the procedure... f/u colonoscopy done 1/10 showed divertics & tiny polyp w/o adenomatous change (+FamHx of colon cancer in brother who died of the disease at age 65)... Repeat due 1/15. ~  4/15:  He knows that he is due for f/u colonoscopy & will get this from a gastroenterologist in [facility] => he tells me this was done, we do not have record.  ADENOCARCINOMA, PROSTATE (ICD-185) >> ~  5/11:  when seen 12/09 his routine PSA was 5.98 & he was referred to [facility] in [facility] where he lives for eval> +prostate cancer and s/p robotic prostatectomy 5/10 & doing well so far w/ PSA <0.01 on last check 11/10 w/ f/u due soon (Q30mo). ~  He had episode of micturition syncope that occurred one night- awoke w/ full bladder & was standing to urinate, then fell w/ transient syncope, injured tooth & fx rib; recovered uneventfully & he is asked to sit down to empty bladder, stand slowly & be careful> no recurrence. ~  9/12:  He tells me that [place] "released me" & he wants [name] to do yearly PSA f/u here; PSA today = zero... ~  10/13:   Clinically stable w/o voiding difficulty, incont, etc; PSA= 0.00 ~  4/15:  He remains clinically stable w/o complaints and f/u PSA remains zero... ~  7/16:  PSA remains 0.00, he is asking about Cialis for daily use => given Rx.  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  ANXIETY (ICD-300.00)  Health Maintenance >> ~  GI:  His brother died age 60 w/ colon cancer; pt had hyperplastic polyp in past; last colonoscopy was 1/10 by DrPatterson- neg, f/u planned 57yrs. ~  GU:  Hx prostate cancer Dx 2010 w/ Bx DrChapman in [place]; s/p robotic surg 5/10 & doing well since then; PSA= zero ~  Immuniz:  He had PNEUMOVAX in 1999, and gets yearly flu shots... Pneumovax f/u vaccination given 12/09... Given  TDAP 5/11   Past Surgical History:  Procedure Laterality Date  . CHOLECYSTECTOMY  2004  . OTHER SURGICAL HISTORY     S/P VATS w/ infected bleb removed from RUL 1997  . PROSTATECTOMY  11/2008   S/P robotic prostatectomy in [facility]    Outpatient Encounter Medications as of 09/10/2017  Medication Sig  . aspirin 325 MG tablet Take 325 mg by mouth daily.    Marland Kitchen esomeprazole (NEXIUM) 40 MG capsule TAKE 1 CAPSULE BY MOUTH  DAILY AT 12 NOON.  Marland Kitchen Fluticasone-Salmeterol (ADVAIR DISKUS) 250-50 MCG/DOSE AEPB Use 1 inhalation orally  twice daily  . Multiple Vitamin (MULTIVITAMIN) tablet Take 1 tablet by mouth daily.    . tadalafil (CIALIS) 5 MG tablet TAKE 1 TABLET BY MOUTH  DAILY  . tiotropium (SPIRIVA HANDIHALER) 18 MCG inhalation capsule INHALE THE CONTENTS OF 1  CAPSULE VIA HANDIHALER  DAILY  . [DISCONTINUED] azithromycin (ZITHROMAX) 250 MG tablet Take as directed  . azithromycin (ZITHROMAX) 250 MG tablet Take as directed  . HYDROcodone-homatropine (HYCODAN) 5-1.5 MG/5ML syrup Take 5 mLs by mouth every 6 (six) hours as needed for cough. Every 4-6 hours as needed  . predniSONE (STERAPRED UNI-PAK 21 TAB) 5 MG (21) TBPK tablet Take as directed   No facility-administered  encounter medications on file as of 09/10/2017.     No Known Allergies    Immunization History  Administered Date(s) Administered  . H1N1 07/01/2008  . Influenza Split 04/26/2011, 05/01/2012  . Influenza,inj,Quad PF,6+ Mos 08/17/2015, 08/21/2016, 06/09/2017  . Pneumococcal Conjugate-13 02/17/2015  . Pneumococcal Polysaccharide-23 07/01/2008  . Td 12/22/2009  . Tdap 04/26/2011    Current Medications, Allergies, Past Medical History, Past Surgical History, Family History, and Social History were reviewed in [employer] record.     Review of Systems        Evrett is remarkably devoid of symptoms- CC is nasal congestion and drainage, min cough w/ clear sput.  The patient denies fever, chills, sweats, anorexia, fatigue, weakness, malaise, weight loss, sleep disorder, blurring, diplopia, eye irritation, eye discharge, vision loss, eye pain, photophobia, earache, ear discharge, tinnitus, decreased hearing, nasal congestion, nosebleeds, sore throat, hoarseness, chest pain, palpitations, syncope, orthopnea, PND, peripheral edema, dyspnea at rest, excessive sputum, hemoptysis, wheezing, pleurisy, nausea, vomiting, diarrhea, constipation, change in bowel habits, abdominal pain, melena, hematochezia, jaundice, gas/bloating, indigestion/heartburn, dysphagia, odynophagia, dysuria, hematuria, urinary frequency, urinary hesitancy, nocturia, incontinence, back pain, joint pain, joint swelling, muscle cramps, muscle weakness, stiffness, arthritis, sciatica, restless legs, leg pain at night, leg pain with exertion, rash, itching, dryness, suspicious lesions, paralysis, paresthesias, seizures, tremors, vertigo, transient blindness, frequent falls, frequent headaches, difficulty walking, depression, anxiety, memory loss, confusion, cold intolerance, heat intolerance, polydipsia, polyphagia, polyuria, unusual weight change, abnormal bruising, bleeding, enlarged lymph nodes, urticaria, allergic  rash, hay fever, and recurrent infections.     Objective:   Physical Exam    WD, WN, 62 y/o WM in NAD... GENERAL:  Alert & oriented; pleasant & cooperative... HEENT:  [place]/AT, EOM-wnl, PERRLA, Fundi-benign, EACs-clear, TMs-wnl, NOSE-clear, THROAT-clear & wnl. NECK:  Supple w/ fairROM; no JVD; normal carotid impulses w/o bruits; no thyromegaly or nodules palpated; no lymphadenopathy. CHEST:  decr BS bilat, clear to P & A; without wheezes/ rales/ or rhonchi heard... HEART:  Regular Rhythm; without murmurs/ rubs/ or gallops. ABDOMEN:  Soft & nontender; normal bowel sounds; no organomegaly or masses detected. EXT: without deformities or arthritic changes; no varicose veins/ venous insuffic/ or edema. NEURO:  CN's intact; motor testing normal; sensory  testing normal; gait normal & balance OK. DERM:  No lesions noted; no rash etc...  RADIOLOGY DATA:  Reviewed in the EPIC EMR & discussed w/ the patient...  LABORATORY DATA:  Reviewed in the EPIC EMR & discussed w/ the patient...   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 Continue [place], Mucinex as needed, etc...  NEW CXR ABNORMALITY 2/18 w/ LUL peripheral abnormality that occurred in the wake of a LUL pneumonia from Oct2017- treated w/ [facility] in [place];  He has had CT, LABS, PET => see above & decision made to continue close follow up w/ serial CXR/ scans...  01/28/17>    We reviewed the serial CT findings & our options;  he remains totally asymptomatic- active in home remodeling, climbing, crawling, working pretty hard w/o dyspnea etc;  In fact his CC is sinus drainage;  Options are to continue current course- no pipe, +Advair/ Spiriva, exercise program, avoid exposures and repeat CT Chest similar 57mo;  Alternatively we could consider Bronch w/ Lavage for cytology &  cultures, or consider second opinion at [place] center near his home... I presented case to DrNestor who agreed w/ the conservative approach at this point in light of no clear cut signs of worsening parenchymal dis...  06/09/17>   Joziyah remains asymtomatic w/o much cough/ sputum/ SOB/ chest discomfort/ etc;  He is not limited in his work/ daily life/ ADLs in any way and he is not inclined to pursue a more aggressive posture for additional evaluation of his pulm process=> we will continue conservative management- he will work on all smoking (pipe) cessation, continue the Advair/ Spiriva, continue exercise, etc... We plan rov in 3-61mo w/ CXR & fasting blood work. 09/10/17>   We decided to treat Juanda Crumble' symptoms w/ ZPak/ Pred dosepak/ Hycodan and he will try to decr & quit the pipe smoking!!! LUL changes continue to evolve & I am concerned that he has an air-fluid level that could get infected it's self (but the norm CBC & Sed rate are reassuring); we will continue to monitor carefully & plan f/u CT Chest [id number]...    PIPE SMOKER>  Jaking understands that he must quit all smoking but doesn't seem able to quit the pipe!  Hx AtypCP>  No further chest discomfort; EKG w/ NSR, IVCD, NAD...  Borderline CHOL>  LDL not at goal but he is not inclined to take statin Rx; therefore diet alone...  GERD>  He has signif symptoms and NOT improved w/ Protonix; NEXIUM is the only PPI that works for him & we will re-write this med...  Colon Polyps>  As noted brother died age 56 w/ colon cancer; last colonoscopy 1/10 was ok & f/u due now- he will get this in [facility]...  PROSTATE CANCER> DrChapman in [place] has released him & asked that we do yearly PSA; todays lab w/ PSA= zero... NOTE: hx one episode of micturition syncope w/o recurrence...  DJD>  Stable, uses OTC analgesics as needed...  Anxiety>  Aware, stable, no meds required...   Patient's Medications  New Prescriptions   AZITHROMYCIN (ZITHROMAX) 250  MG TABLET    Take as directed   HYDROCODONE-HOMATROPINE (HYCODAN) 5-1.5 MG/5ML SYRUP    Take 5 mLs by mouth every 6 (six) hours as needed for cough. Every 4-6 hours as needed  PREDNISONE (STERAPRED UNI-PAK 21 TAB) 5 MG (21) TBPK TABLET    Take as directed  Previous Medications   ASPIRIN 325 MG TABLET    Take 325 mg by mouth daily.     ESOMEPRAZOLE (NEXIUM) 40 MG CAPSULE    TAKE 1 CAPSULE BY MOUTH  DAILY AT 12 NOON.   FLUTICASONE-SALMETEROL (ADVAIR DISKUS) 250-50 MCG/DOSE AEPB    Use 1 inhalation orally  twice daily   MULTIPLE VITAMIN (MULTIVITAMIN) TABLET    Take 1 tablet by mouth daily.     TADALAFIL (CIALIS) 5 MG TABLET    TAKE 1 TABLET BY MOUTH  DAILY   TIOTROPIUM (SPIRIVA HANDIHALER) 18 MCG INHALATION CAPSULE    INHALE THE CONTENTS OF 1  CAPSULE VIA HANDIHALER  DAILY  Modified Medications   No medications on file  Discontinued Medications   AZITHROMYCIN (ZITHROMAX) 250 MG TABLET    Take as directed

## 2017-09-10 NOTE — Patient Instructions (Addendum)
Today we updated your med list in our EPIC system...    Continue your current medications the same...  Today we checked your follow up CXR & FASTING blood work...    We will contact you w/ the results when available...   We decided to treat your bronchitic infection with a ZPak & Prednisone Dosepak-- take as directed on the packs...  We also wrote for a cough syrup-- use the HYCODAN one tsp every 4-6H as needed for the cough...  Continue the Advair & Spiriva as you are doing...  Keep up the great job w/ diet & exercise...    But do a better job w/ smoking cessation- quit the pipe Fred [name]!!!  Call for any questions...  Let's plan a follow up visit in 21mo w/ a follow up CT Chest at that time.Marland KitchenMarland Kitchen

## 2017-09-11 ENCOUNTER — Telehealth: Payer: Self-pay | Admitting: Pulmonary Disease

## 2017-09-11 NOTE — Telephone Encounter (Signed)
Called and spoke with Audelia Acton regarding pt's Hycodan Rx due to Rx being from an out of state office he wanted to make sure we were the ones that prescribed the med for pt.  Med was verified. Nothing further needed at this current time.

## 2017-09-22 ENCOUNTER — Ambulatory Visit: Payer: 59 | Admitting: Pulmonary Disease

## 2017-10-17 ENCOUNTER — Other Ambulatory Visit: Payer: Self-pay | Admitting: Pulmonary Disease

## 2017-11-06 ENCOUNTER — Ambulatory Visit (HOSPITAL_COMMUNITY)
Admission: RE | Admit: 2017-11-06 | Discharge: 2017-11-06 | Disposition: A | Discharge status: Home / Self Care | Payer: 59 | Source: Ambulatory Visit | Attending: Pulmonary Disease | Admitting: Pulmonary Disease

## 2017-11-06 ENCOUNTER — Other Ambulatory Visit (INDEPENDENT_AMBULATORY_CARE_PROVIDER_SITE_OTHER): Payer: 59

## 2017-11-06 ENCOUNTER — Encounter: Payer: Self-pay | Admitting: Pulmonary Disease

## 2017-11-06 ENCOUNTER — Ambulatory Visit (INDEPENDENT_AMBULATORY_CARE_PROVIDER_SITE_OTHER): Payer: 59 | Admitting: Pulmonary Disease

## 2017-11-06 ENCOUNTER — Ambulatory Visit (INDEPENDENT_AMBULATORY_CARE_PROVIDER_SITE_OTHER): Payer: 59

## 2017-11-06 ENCOUNTER — Ambulatory Visit (INDEPENDENT_AMBULATORY_CARE_PROVIDER_SITE_OTHER)
Admission: RE | Admit: 2017-11-06 | Discharge: 2017-11-06 | Disposition: A | Discharge status: Home / Self Care | Payer: 59 | Source: Ambulatory Visit | Attending: Pulmonary Disease | Admitting: Pulmonary Disease

## 2017-11-06 VITALS — BP 128/64 | HR 78 | Temp 97.5°F | Ht 73.0 in | Wt 180.6 lb

## 2017-11-06 DIAGNOSIS — J432 Centrilobular emphysema: Secondary | ICD-10-CM

## 2017-11-06 DIAGNOSIS — F17290 Nicotine dependence, other tobacco product, uncomplicated: Secondary | ICD-10-CM

## 2017-11-06 DIAGNOSIS — R9389 Abnormal findings on diagnostic imaging of other specified body structures: Secondary | ICD-10-CM | POA: Diagnosis not present

## 2017-11-06 DIAGNOSIS — R059 Cough, unspecified: Secondary | ICD-10-CM

## 2017-11-06 DIAGNOSIS — J069 Acute upper respiratory infection, unspecified: Secondary | ICD-10-CM | POA: Diagnosis not present

## 2017-11-06 DIAGNOSIS — R05 Cough: Secondary | ICD-10-CM

## 2017-11-06 DIAGNOSIS — I2510 Atherosclerotic heart disease of native coronary artery without angina pectoris: Secondary | ICD-10-CM | POA: Diagnosis not present

## 2017-11-06 DIAGNOSIS — I700 Atherosclerosis of aorta: Secondary | ICD-10-CM | POA: Diagnosis not present

## 2017-11-06 DIAGNOSIS — J439 Emphysema, unspecified: Secondary | ICD-10-CM | POA: Diagnosis not present

## 2017-11-06 LAB — COMPREHENSIVE METABOLIC PANEL
ALT: 13 U/L (ref 0–53)
AST: 15 U/L (ref 0–37)
Albumin: 4 g/dL (ref 3.5–5.2)
Alkaline Phosphatase: 93 U/L (ref 39–117)
BUN: 6 mg/dL (ref 6–23)
CO2: 29 meq/L (ref 19–32)
Calcium: 8.8 mg/dL (ref 8.4–10.5)
Chloride: 105 mEq/L (ref 96–112)
Creatinine, Ser: 0.81 mg/dL (ref 0.40–1.50)
GFR: 103.04 mL/min (ref >60.00)
GLUCOSE: 92 mg/dL (ref 70–99)
Potassium: 3.8 mEq/L (ref 3.5–5.1)
SODIUM: 138 meq/L (ref 135–145)
Total Bilirubin: 0.4 mg/dL (ref 0.2–1.2)
Total Protein: 7.1 g/dL (ref 6.0–8.3)

## 2017-11-06 LAB — CBC WITH DIFFERENTIAL/PLATELET
Basophils Absolute: 0 10*3/uL (ref 0.0–0.1)
Basophils Relative: 0.4 % (ref 0.0–3.0)
EOS ABS: 0.5 10*3/uL (ref 0.0–0.7)
Eosinophils Relative: 5 % (ref 0.0–5.0)
HCT: 40.3 % (ref 39.0–52.0)
Hemoglobin: 13.9 g/dL (ref 13.0–17.0)
LYMPHS ABS: 1.4 10*3/uL (ref 0.7–4.0)
Lymphocytes Relative: 15.1 % (ref 12.0–46.0)
MCHC: 34.4 g/dL (ref 30.0–36.0)
MCV: 88 fl (ref 78.0–100.0)
MONO ABS: 0.9 10*3/uL (ref 0.1–1.0)
Monocytes Relative: 10 % (ref 3.0–12.0)
NEUTROS ABS: 6.5 10*3/uL (ref 1.4–7.7)
NEUTROS PCT: 69.5 % (ref 43.0–77.0)
PLATELETS: 299 10*3/uL (ref 150.0–400.0)
RBC: 4.58 Mil/uL (ref 4.22–5.81)
RDW: 13.9 % (ref 11.5–15.5)
WBC: 9.4 10*3/uL (ref 4.0–10.5)

## 2017-11-06 LAB — SEDIMENTATION RATE: Sed Rate: 44 mm/hr — ABNORMAL HIGH (ref 0–20)

## 2017-11-06 MED ORDER — PREDNISONE 20 MG PO TABS: ORAL_TABLET | ORAL | 0 refills | Status: DC

## 2017-11-06 MED ORDER — LEVALBUTEROL HCL 0.63 MG/3ML IN NEBU
0.6300 mg | INHALATION_SOLUTION | Freq: Once | RESPIRATORY_TRACT | Status: AC
  Administered 2017-11-06: 0.63 mg via RESPIRATORY_TRACT

## 2017-11-06 MED ORDER — AMOXICILLIN-POT CLAVULANATE 875-125 MG PO TABS: 1.0000 | ORAL_TABLET | Freq: Two times a day (BID) | ORAL | 0 refills | Status: DC

## 2017-11-06 MED ORDER — METHYLPREDNISOLONE ACETATE 80 MG/ML IJ SUSP
120.0000 mg | Freq: Once | INTRAMUSCULAR | Status: AC
Start: 2017-11-06 — End: 2017-11-06
  Administered 2017-11-06: 120 mg via INTRAMUSCULAR

## 2017-11-06 NOTE — Progress Notes (Signed)
Subjective:    Patient ID: Fred [name], male    DOB: 12-10-1955, 62 y.o.   MRN: [id number]  HPI 62 y/o WM here for a follow up visit... he has multiple medical problems as noted below...  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 ~  SEE PREV EPIC NOTES FOR OLDER DATA >>   ~  May 01, 2102:  [name] CPX> [name] has had a good year, no new complaints or concerns except his insurance co difficulty w/ Nexium64m/d & he will check w/ [name] & 30d vs 90d via his [employer]...    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 CXR is unchanged...    Chol> on diet alone; he had recent business trip & is worried his diet wasn't what it should have been; FLP showed TChol 198, TG 99, HDL 47, LDL 131    GERD> on Nexium40; Protonix wasn't helpful & he is hopeful that his insurance co will cover the needed Nexium...    Colon Polyp & +FamHx colon ca> last colon 102/03/24w/ divertics & tiny polyp, f/u due 1/15 and he remains asymptomatic...    Prostate cancer> s/p robotic prostatectomy 5/10 [place] asked [place] PSA [place]; Labs today showed PSA= 0.00 We reviewed prob list, meds, xrays and labs> see below for updates >> OK 2013 Flu vaccine today...  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  LABS 10/13:  FLP- ok x LDL=131 on diet alone;  Chems- wnl;  CBC- wnl;  TSH=0.79;  PSA=0.00;  Urine- clear...  ~  November 03, 2013:  62moOV & Orlie reports a good interval w/o new complaints or concerns; getting regular exercise (retired) and feeling well- he notes some troublesome sinus  drainage and recent "cold" for which we called in a Pred dosepak but he notes min change; we discussed Rx w/ OTC Antihist, Nasal saline mist Q1-2h, Mucinex- 2Bid, Fluticasone 2sp each nostril Qhs, and Rx for Astelin- 2sp each nostril Bid as needed; he still smokes a pipe regularly & again asked to quit! [facility]... We reviewed the following medical problems during today's office visit >>     COPD, Emphysematous bleb, s/p RUL bullectomy 1997 for infected bleb w/ A/F level, pipe smoker> on Advair250, Spiriva; no intercurrent infections, min cough & sput, no hemoptysis, stable DOE w/o change, no edema, etc; he reports cut back on pipe smoking to ~50% of former level & asked to decr further & work on smoking cessation! CXR is stable, unchanged...    Chol> on diet alone & his weight is good; FLP 4/15 showed TChol 174, TG 55, HDL 43, LDL 120; he does not want meds despite not being optimal, reviewed diet...    GERD> on Nexium40; Protonix wasn't helpful; he denies adb pain, dysphagia, n/v, c/d, blood seen...     Colon Polyp & +FamHx colon ca (bro died at 52)> last colon 07/2022-02-03/ divertics & tiny polyp, f/u due 1/15 and he remains asymptomatic (he will get this  in [facility])...    Prostate cancer> s/p robotic prostatectomy 5/10 & DrChapman in [place] 9/12 & asked [name] to monitor PSA yearly; Labs today showed PSA= 0.00 We reviewed prob list, meds, xrays and labs> see below for updates >> he did not get the 2014 Flu vaccine last fall & reminded to get this every yr at local pharm...  CXR 4/15 showed norm heart size, vol loss on right w/ pleuroparenchymal scarring, hyperinflation on left w/ bullous emphysema, stable- NAD...  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;  He reports breathing is doing OK & he had one infectious exac 2/16 (we called in meds); unfortunately he is still smoking a pipe "I enjoy it" but less tobacco than before he says; in addition he has been using his Spiriva "prn" as he freq forgets this dose-- we had a productive conversation about his bullous emphysema/ prev surg/ etc, I indicated that while I am delighted that he remains relatively asymptomatic at this time, that he is more rapidly approaching a life of deteriorating resp function w/ daily symptoms/ chronic resp failure/ need for oxygen 24/7, etc; he understands this & I have made yet another pitch for complete smoking cessation & taking his meds regularly day-in and day-out...     COPD, Emphysematous bleb, s/p RUL bullectomy 1997 for infected bleb w/ A/F level, pipe smoker> on Advair250Bid, Spiriva daily; no intercurrent infections, min cough & sput, no hemoptysis, stable DOE w/o change, no edema, etc; he reports cut back on pipe smoking but must quit completely as above; CXR is stable, unchanged w/ chr changes on right & bullous dis on left...    Chol> on diet alone & his weight is good; FLP 7/16 showed TChol 154, TG 79, HDL 41, LDL 97 => improved, keep up the good work!    GERD> on Nexium40; Protonix wasn't helpful; he denies abd pain, dysphagia, n/v, c/d, blood seen; he tried off the Nexium- symptoms returned therefore back on the PPI daily...    Colon Polyp & +FamHx colon ca (bro died at 52)> last colon 119-Jan-2024w/ divertics & tiny polyp, f/u done in [place] in 2015, he reports neg & [place]    Prostate cancer> s/p robotic prostatectomy 5/10 [place] asked [place] PSA yearly; Labs today showed PSA= 0.00, requests Cialis for daily use. We reviewed prob list, meds, xrays and labs> see below for updates >> Given PREVNAR-13 today...  CXR  02/17/15 showed norm heart size, severe COPD w/ bullous changes on left & s/p surg/ scarring on right, NAD/ no change from 2015...  Spirometry 02/17/15 showed FVC=3.25 (64%), FEV1=1.82 (46%), %1sec=56, mid-flows reduced at 21% predicted; c/w mod airflow obstruction & GOLD Stage3 COPD (can't r/o superimposed restriction w/o LVs)...  LABS 02/17/15:  FLP- all parameters at goals on diet;  Chems- wnl;  CBC- wnl;  TSH=1.10;  PSA= 0.00    IMP/PLAN>>  PFT shows functional deterioration from last studies, now GOLD Stage3, CXR unchanged, Labs OK;  He knows the score & must quit all smoking, take meds every day, try  to avoid infections, increase exercise program..   ~  September 25, 2016:  25moROV & CPX>  CNickalausreports doing well & only recalls one resp exac since last visit- records in Epic/ CareEverywhere reviewed>>    He called here 03/21/16 c/o nasal congestion, chest tight, cough w/ clear mucus;  We called in Pred dosepak, Mucinex, nasal saline...    He called back 8/31 c/o persistent cough (now nonproductive) chest tight/ SOB, denied f/c/s;  Asked to go to PCP vs UC for exam & to be checked...    [facility]> c/o cough & cold-like sx for 1wk, T101.5, CXR showed LUL pneumonia, given IM-Rocephin, Levaquin500 x10d, Tussionex...    He was f/u PCP-[facility] on 9/5> c/o persist fever/ cough/ congestion but feeling sl better; Temp 100.8 &he was given Rocephin IM, continue Levaquin...    He was rechecked 9/7> clinically improved, T98.9, given 10 more days of Levaquin500...    Pt returned 10/13> c/o 1wk hx recurrent cough, sputum, congestion & Dx w/ bronchitis=> given another 10d Levaquin500, no f/u CXR done. Currently he notes a mild "cold" w/ min cough, no sput, no hemoptysis, no f/c/s, & stable SOB/DOE esp if going uphill, pushing wheelbarrow, etc... 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 we reviewed the following medical problems during today's office visit >>     COPD, Emphysematous bleb, s/p RUL bullectomy 1997 for infected bleb w/ A/F level, pipe smoker> on Advair250Bid, Spiriva daily; min cough & sput, no hemoptysis, stable DOE w/o change, no edema, etc; he reports cut back on pipe smoking but must quit completely as above; CXR was stable x yrs w/ chr changes on right & bullous dis on left; then f/u CXR 09/25/16 showed new LUL lesion => CT pending.    New LUL peripheral lesion/ CXR abnormality> found on routine CXR 09/25/16 => work-up in progress w/ CT Chest pending...    Chol> on diet alone & his weight is good; FLP 2/18 showed TChol 164, TG 71, HDL 42, LDL 108 => stable & doing satis on diet...    GERD> on Nexium40; Protonix wasn't helpful; he denies abd pain, dysphagia, n/v, c/d, blood seen; he tried off the Nexium- symptoms returned therefore back on the PPI daily...    Colon Polyp & +FamHx colon ca (bro died at 52)> colon 101/19/2024w/ divertics & tiny polyp, f/u done in [place] in 2015, he reports neg & [place]    Prostate cancer> s/p robotic prostatectomy 5/10 [place] & asked [place] PSA yearly; Labs today showed PSA= 0.00, requests Cialis for daily use. EXAM shows Afeb, VSS, O2sat=99% on RA;  HEENT- neg, mallampati1;  Chest- decr BS but clear w/o w/r/r;  Heart- RR gr1/6 SEM w/o r/g;  Abd- soft, nontender, neg;  Ext- neg w/o c/c/e;  Neuro- intact w/o focal abn...  CXR 09/25/16>  Norm heart size, new pleuroparenchymal thickening & nodular density in left apex measuring about 2cm, increased interstitial markings bilat, emphysema & bullous changes bilat...  EKG 09/25/16>  NSR, rate62, IVCD, NAD...  LABS 09/25/16>  FLP- ok w/ LDL=108 on diet alone;  Chems- wnl;  CBC- wnl;  TSH=1.27;  PSA=0.00    IMP/PLAN>>  CKarmelohas a new abn on his f/u CXR in the left apex=> needs CT Chest ASAP for further eval &  we will sched;   In the meanwhile we (again) reviewed the need to quit all smoking (he still smokes a pipe) and take meds regularly- Advair250Bid & Spiriva daily;  He needs to incr his exercise in a gradual exercise program as well;  We gave him the 2017 FLU vaccine today & a prescription for a ZPak to keep on hand.  ADDENDUM>>  CT CHEST w/ contrast 10/02/16 showed norm heart size, atherosclerosis Ao & L-Circ, norm caliber PAs & no PE seen; mod centrilob & paraseptal emhysema w/ bullous changes bilat & s/p RUL wedge resection w/ post surg scarring; thick walled irreg 6.5 x 3.7 cm cavitary lesion in LUL apical-post;  2 small nodules 4-18m size in lingula and RML noted; two 1.0-1.2cm nodes (L mediastinal & R hilar); s/pGB & 2cm R-renal cyst... NOTE: we discussed CT results and options=> decided to proceed w/ PET scan...  ADDENDUM>>  PET scan 10/23/16 showed a cavitary left apical process w/ nodularity along its inferior border tracking along the pleural surface (similar appearance to the recent CT Chest)- max assoc SUV is along this inferior pleural nodularity w/ max SUV=5.8;  Underlying emphysema, calcif biapical scarring R>L;  approx 6 x 2 cm periph density LLL posteromedially w/ max SUV=2.2 & likely benign;  Right hilar adenopathy is faintly hypermet w/ max SUV=3.0;  Incidental focal metabolic actiivity along the cecum w/ SUV max=9.7 (tumor cannot be excluded, no adenopathy apprec in this area)=> we will refer to his GI...  I reviewed these scans w/ Radiology & their IR team=> they felt that needle bx LUL could be done but carried a signif risk of pneumothorax & they suggested watchful waiting w/ repeat CT Chest in 3-4 months;  I called Pt & wife to review all this & they are in agreement w/ conservative approach & repeat scan in [id number] planned;  They know to call me for any problems, CHerronwill call his gastroenterologist for OV to discuss PET scan & address ?need for repeat colon at this time...    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 He remains on ADVAIR250Bid & SPIRIVA once daily, uses Mucinex prn, and has had ZPak & Cipro given in the interim;  He still smokes the pipe- "I'm cutting back every day" & I implored him to quit completely... we reviewed the following medical problems during today's office visit >>     COPD, Emphysematous blebs, s/p RUL bullectomy 1997 for infected bleb w/ A/F level, pipe smoker> on Advair250Bid, Spiriva daily; min cough & sput, no hemoptysis, stable DOE w/o change, no edema, etc; he reports cut back on pipe smoking but must quit completely as above; CXR was stable x yrs w/ chr changes on right & bullous dis on left; then he developed LUL pneumonia [place], & f/u CXR here 09/25/16 showed new LUL abnormality =>  CT Chest, subseq PET scan, and serial CT scans as documented...    Hx LUL pneumonia [id number] w/ residual LUL parenchymal abnormality> clinically pt is feeling & breathing well, at baseline, w/o much cough/ sputum/ etc; we considered bronch & lavage- following serial scans for now...    Chol> on diet alone & his weight is good; FLP 2/18 showed TChol 164, TG 71, HDL 42, LDL 108 => stable & doing satis on diet...    GERD> on Nexium40; Protonix wasn't helpful; he denies abd pain, dysphagia, n/v, c/d, blood seen; he tried off the Nexium- symptoms returned therefore back on the PPI daily...    Colon Polyp & +FamHx colon ca (bro died  at 52)> colon 2022/09/02 w/ divertics & tiny polyp, f/u done in [place] in 2015, he reports neg & will get copies sent to [name]; PET scan 09/2016 for lung abn showed focal metabolic activ along the cecum (SUV 9.7) & he had f/u w/ DrSingh in [place] w/ repeat colon showing 1 sm polyp, otherw neg...    Prostate cancer> s/p robotic prostatectomy 5/10 & DrChapman in [place] 9/12 & asked [name] to monitor PSA yearly; Labs 2/18 showed PSA= 0.00, requests Cialis for daily use. EXAM shows Afeb, VSS, O2sat=98% on RA;  HEENT- neg, mallampati1;  Chest- decr BS but clear w/o w/r/r;  Heart- RR gr1/6 SEM w/o r/g;  Abd- soft, nontender, neg;  Ext- neg w/o c/c/e;  Neuro- intact w/o focal abn...  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 I presented case to DrNestor who agreed w/ the conservative approach at this point in light of no clear cut signs of worsening parenchymal dis... We will recheck pt in Nov w/ another CT Chest...  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 He is takingAdvair250Bid, Spiriva once daily, & he has a Zpak for prn use if nec...     Problem List as above- reviewed w/ pt>>  EXAM shows Afeb, VSS, O2sat=97% on RA;  HEENT- neg, mallampati1;  Chest- decr BS but clear w/o w/r/r;  Heart- RR gr1/6 SEM w/o r/g;  Abd- soft, nontender, neg;  Ext- neg w/o c/c/e;  Neuro- intact w/o focal abn...  CT Chest 06/09/17 (independently reviewed by me in the PACS system) shows norm heart size w/ Ao & coronary atherosclerotic calcif; no  adenopathy, biapical pleuroparenchymal scarring w/ bullous emphysema, thick-walled bullous lesion in left apex has a new A/F level & findings are otherw unchanged from 01/28/17, a chr infectious process is favored...  IMP/PLAN>>  Sedrick remains asymtomatic w/o much cough/ sputum/ SOB/ chest discomfort/ etc;  He is not limited in his work/ daily life/ ADLs in any way and he is not inclined to pursue a more aggressive posture for additional evaluation of his pulm process=> we will continue conservative management- he will work on all smoking (pipe) cessation, continue the Advair/ Spiriva, continue exercise, etc... We plan rov in 3-4mow/ CXR & fasting blood work...       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 No CP/ palpit/ edema; NOTE- he is still smoking a pipe despite all efforts to get him to quit...  We reviewed the following medical problems during today's office visit>       COPD, Emphysematous blebs, s/p RUL bullectomy 1997 for infected bleb w/ A/F level> on Advair250Bid, Spiriva daily; min cough & sput, no hemoptysis, stable DOE w/o change, no edema, etc; he reports cut back on pipe smoking but must quit completely as above; CXR was stable x yrs w/ chr changes on right & bullous dis on left; then he developed LUL pneumonia [place]2017 [place], & f/u CXR here 09/25/16 showed new LUL abnormality => CT Chest, subseq PET scan, and serial CT scans as documented...    Hx LUL pneumonia [id number]/ residual LUL parenchymal abnormality> clinically pt is feeling & breathing well, at baseline, w/o much cough/ sputum/ etc; we considered bronch & lavage- following serial scans for now...    PIPE SMOKER>      Chol> on diet alone & his weight is good; FLP 2/18 showed TChol 164, TG 71, HDL 42, LDL 108 => stable & doing satis on diet...    GERD> on Nexium40; Protonix wasn't helpful; he denies abd pain, dysphagia, n/v, c/d, blood seen; he tried off the Nexium- symptoms returned therefore back on the PPI daily...    Colon Polyp & +FamHx colon ca (bro died at 52)> colon 07/29/21/2024/ divertics & tiny polyp, f/u done in [place] in 2015, he reports neg & [place] for lung abn showed focal metabolic activ along the cecum (SUV 9.7) & [place] showing 1 sm polyp, otherw neg...    Prostate cancer> s/p robotic prostatectomy 5/10 [place] asked [place] PSA yearly; Labs 2/18 showed PSA= 0.00, requests Cialis for daily use. EXAM shows Afeb, VSS, O2sat=97% on RA;  HEENT- neg, mallampati1;  Chest- decr BS but clear w/o w/r/r;  Heart- RR gr1/6 SEM w/o r/g;  Abd- soft, nontender, neg;  Ext- neg w/o c/c/e;  Neuro- intact w/o focal abn...  CXR 09/10/17 (independently reviewed by me in the PACS system) showed norm heart size, COPD/ emphysema w/ biapical bullous formation L>R w/ air-fluid  level in bleb...  LABS 09/10/17>  FLP- ok on diet alone;  Chems- wnl;  CBC- wnl;  TSH=1.38;  PSA=0.00;  Sed=12  IMP/PLANS>>  We decided to treat Juanda Crumble' symptoms w/ ZPak/ Pred dosepak/ Hycodan and he will try to decr & quit the pipe smoking!!! LUL changes continue to evolve & I am concerned that he has an air-fluid level that could get infected it's self (but the norm CBC & Sed rate are reassuring); we will continue to monitor carefully & plan f/u CT Chest [id number]...    ~  November 06, 2017:  55moROV & add-on appt requested for a several week hx of incr cough, small amt of yellow sputm production, nasal & chest congestion w/ sneezing, drainage, incr SOB & chest tightness;  He thinks poss low grade fever (didn't take temp but hot/cold w/ chills), denies CP, no hemoptysis; he has remained regular w/ his Advair250Bid & Spiriva daily, he has Hycodan to use as needed; unfortunately he continues to smoke his pipe & did so on the way to [place] this AM from [place]..     He has known bullous emphysema w/ hx infected RUL bleb w/ air-fluid level that required surg in 1997; hx normal A1AT level & MM phenotype, neg fam hx emphysema or pulm problems; persistent pipe smoker; he had been stable w/ his baseline GOLD Stage 3 COPD/emphysema (FEV1=1.82, 46%predicted) until the Fall of 2017 when [facility] but w/ signif changes in his CXR & CTChest showing worsening LUL dis w/ pleuro-parenchymal changes and a smaller A/F level seen;  We treated him w/ additional antibiotics and Prednisone & have been following this conservatively w/ CXR/ scans/ etc...     EXAM shows Afeb, VSS, O2sat=98% on RA;  HEENT- neg, mallampati1;  Chest- decr BS w/ few bibasilar rhonchi, no wheezing/ rales/ or consolidation;  Heart- RR gr1/6 SEM w/o r/g;  Abd- soft, nontender, neg;  Ext- neg w/o c/c/e;  Neuro- intact w/o focal abn...  CXR 11/06/17>  (independently reviewed by me in the PACS  system) showed baseline COPD/emphysema w/ LUL bullous lesion w/ ?larger A/F level & incr markings in LUL- ant & central regions, suggest f/u CT for better delineation...   Given NEB treatment in office but unable to expectorate any sput for cultures...  Ambulatory Oximetry 11/06/17>  O2sat=100% on RA at rest w/ pulse=80/min;  Pt ambulated 3 laps in the office (185'each) w/ lowest O2sat=98% w/ pulse=92/min... No desaturations.  LABS 11/06/17>  Chems- wnl w/ BS=92, Cr=0.81, LFTs wnl etc;  CBC- wnl w/ Hg=13.9, WBC=9.4 w/ norm diff;  Sed=44  CT Chest 11/06/17>  Norm heart size, coronary & Ao atherosclerosis, norm caliber pulm arteries; mod centrilob & paraseptal emphysema w/ diffuse bronch wall thickening, postsurg changes from RUL apical bleb resection; thick walled post left apical/LUL bullous lesion w/ A/F level similar to 06/09/17 scan; scat LUL nodules stable from priors and considered benign; new patchy consolidation & GGO in LUL ant & central area w/ tree-in-bud opac- ?progression of chr infections vs new superimposed infectious process; area fo left pleural thickening w/o effusion...  IMP/PLAN>>  CT w/ ?worsening or ?superimposed LUL parenchymal process- no sput to culture, Afeb, Labs are ok w/ norm CBC/Chem panel but elev sed at 44; we decided to treat w/ AUGMENTIN 875Bid x 10d (cover w/ Align & Activia), plus a course of oral PRED- Depo120 today, then Pred'20mg'$  Bid x1wk, Qd x1wk, then '10mg'$  daily til ret in 4wks;  Rec Mucinex, water, Hycodan prn; call for any worsening in breathing;  NO SMOKING and  take it easy until recheck appt...           Problem List:  ALLERGY (ICD-995.3) - uses OTC antihistamines Prn...  COPD (ICD-496) & EMPHYSEMATOUS BLEB (ICD-492.0) - on [place] daily... unfortunately he still smokes a pipe daily (w/ min cough, drainage, etc)... he has a neg FamHx of lung disease and a normal A1AT level (MM phenotype)... he is s/p VATS- RUL bullectomy in 1997 for infected  bulla w/ A/F level. ~  baseline CXR's back to 1990 showed large bilat bullae R>L & scarring at left base... ~  f/u CXR's w/ vol loss on right from the surg, bullae on the left, scarring, etc... ~  prev CT Chest revealed mult blebs & bullae in RUL & LUL to the lingular w/ scarring...  ~  1997= 14cm RUL bulla w/ AF level (required VATS Bullectomy- path= emphysema, infected bulla w/ acute/ chr inflamm). ~  CT Chest 10/09 in [place]= similar bullous dis on left w/ emphysema, post-op changes on right, NAD,etc. ~  PFT 6/00 showed FVC= 4.41 (84%), FEV1= 2.53 (59%), %1sec= 57, mid-flows= 25%pred... ~  PFT 6/06 showed FVC= 4.59 (87%), FEV1= 2.79 (65%), %1sec= 61, mid-flows= 27%pred... ~  CXR 5/11 showed advanced chr lung dis w/ hyperinflation, scarring, decr vol on right> NAD. ~  PFT 9/12 showed FVC= 4.07 (79%), FEV1= 2.34 (57%), %1sec= 57, mid-flows= 24%pred... c/w GOLD Stage2 COPD- MUST QUIT ALL SMOKING! ~  CXR 9/12 showed no signif change in his bullous lung dis & evid of prev surg on the right... Asked again to quit the pipe. 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 Must quit all smoking! 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 ~  Spirometry 02/17/15 showed FVC=3.25 (64%), FEV1=1.82 (46%), %1sec=56, mid-flows reduced at 21% predicted; c/w mod airflow obstruction & GOLD Stage3 COPD (can't r/o superimposed restriction w/o LVs)... ~  CXR 09/25/16>  Norm heart size, new pleuroparenchymal thickening & nodular density in left apex measuring about 2cm, increased interstitial markings bilat, emphysema & bullous changes bilat => proceed w/ CT Chest.  ABN CXR w/ LUL peripheral lesion found on routine CXR 09/25/16=> eval in progress... ~  He had LUL  pneumonia treated by PCP in [facility] during the Fall2017 but this was not followed up by them...   Hx of CHEST PAIN, ATYPICAL (ICD-786.59) - on ASA '81mg'$ /d... hx intermittent chest wall tightness/ spasm/ ?palpit/ numb in left arm- last 2-3 min and resolves spont, not exercise induced... s/p cardiac eval 2/08 by DrCooper- he wanted to do treadmill and holter but pt never did these and states he is doing fine w/o any incr in symptoms... ~  EKG 5/11 showed NSR 74/min, IVCD, NAD (no ch from old tracings) ~  EKG 9/12 showed NSR 68/min, IVCD, NAD & no change...  HYPERCHOLESTEROLEMIA, MILD (ICD-272.0) - on diet alone... ~  [place] 12/07 showed TChol 182, TG 80, HDL 36, LDL 130... he prefers diet Rx. ~  [place] 12/09 showed TChol 172, TG 81, HDL 36, LDL 120... rec- improve diet efforts, or consider statin. ~  FLP 5/11 showed TChol 169, TG 94, HDL 37, LDL 113... continue diet efforts. ~  FLP 9/12 showed TChol 172, TG 52,  HDL 43, LDL 118 ~  FLP 10/13 on diet alone showed TChol 198, TG 99, HDL 47, LDL 131... Offered Crestor'5mg'$ /d, he will decide. ~  FLP 4/15 on diet alone showed TChol 174, TG 55, HDL 43, LDL 120... He prefers diet/ exercise & declined low dose statin rx. ~  FLP 7/16 on diet alone showed TChol 154, TG 79, HDL 41, LDL 97 ~  FLP 2/18 on diet alone showed TChol 164, TG 71, HDL 42, LDL 108  GERD (ICD-530.81) - on NEXIUM '40mg'$ /d... he states he tried off PPI meds but had to restart due to signif heartburn... ~  9/12:  He reports that insurance co required switch off Nexium onto Protonix but the latter isn't working w/ return of severe reflux symptoms & he wants to restart the [place] '40mg'$ /d... ~  10/13:  Doing satis back on the Nexium, but trouble w/ coverage thru his insurance... ~  4/15:  He continues stable on the Nexium40...  COLONIC POLYPS (ICD-211.3) & Family Hx of ADENOCARCINOMA, COLON (ICD-153.9)  - colonoscopy 7/01 by DrPatterson showed diminutive rectal polyp= hyperplastic... f/u planned 53yr but pt  cancelled and never rescheduled the procedure... f/u colonoscopy done 1/10 showed divertics & tiny polyp w/o adenomatous change (+FamHx of colon cancer in brother who died of the disease at age 61... Repeat due 1/15. ~  4/15:  He knows that he is due for f/u colonoscopy & [facility] he tells me this was done, we do not have record.  ADENOCARCINOMA, PROSTATE (ICD-185) >> ~  5/11:  when seen 12/09 his routine PSA was 5.98 & [facility] +prostate cancer and s/p robotic prostatectomy 5/10 & doing well so far w/ PSA <0.01 on last check 11/10 w/ f/u due soon (Q670mo ~  He had episode of micturition syncope that occurred one night- awoke w/ full bladder & was standing to urinate, then fell w/ transient syncope, injured tooth & fx rib; recovered uneventfully & he is asked to sit down to empty bladder, stand slowly & be careful> no recurrence. ~  9/12:  He tells me that DrBlack Jackreleased me" & he wants [place] do yearly PSA f/u here; PSA today = zero... ~  10/13:  Clinically stable w/o voiding difficulty, incont, etc; PSA= 0.00 ~  4/15:  He remains clinically stable w/o complaints and f/u PSA remains zero... ~  7/16:  PSA remains 0.00, he is asking about Cialis for daily use => given Rx.  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  ANXIETY (ICD-300.00)  Health Maintenance >> ~  GI:  His brother died age 11283/ colon cancer; pt had hyperplastic polyp in past; last colonoscopy was 1/10 by DrPatterson- neg, f/u planned 5y94yr~  GU:  [place] prostate cancer [place] & doing well since then; PSA= zero ~  Immuniz:  He had PNEUMOVAX in 1999, and gets yearly flu shots... Pneumovax f/u vaccination given 12/09... Given TDAP 5/11   Past Surgical History:  Procedure Laterality Date  . CHOLECYSTECTOMY  2004  . OTHER SURGICAL HISTORY      S/P VATS w/ infected bleb removed from RUL 1997  . PROSTATECTOMY  11/2008   S/P robotic prostatectomy in [facility] Outpatient Encounter Medications as of 11/06/2017  Medication Sig  . aspirin 325 MG tablet Take  325 mg by mouth daily.    Marland Kitchen esomeprazole (NEXIUM) 40 MG capsule TAKE 1 CAPSULE BY MOUTH  DAILY AT 12 NOON.  Marland Kitchen Fluticasone-Salmeterol (ADVAIR DISKUS) 250-50 MCG/DOSE AEPB USE 1 INHALATION ORALLY  TWICE DAILY  . Multiple Vitamin (MULTIVITAMIN) tablet Take 1 tablet by mouth daily.    . tadalafil (CIALIS) 5 MG tablet TAKE 1 TABLET BY MOUTH  DAILY  . tiotropium (SPIRIVA HANDIHALER) 18 MCG inhalation capsule INHALE THE CONTENTS OF 1  CAPSULE VIA HANDIHALER  DAILY  . [DISCONTINUED] azithromycin (ZITHROMAX) 250 MG tablet Take as directed  . [DISCONTINUED] predniSONE (STERAPRED UNI-PAK 21 TAB) 5 MG (21) TBPK tablet Take as directed  . amoxicillin-clavulanate (AUGMENTIN) 875-125 MG tablet Take 1 tablet by mouth 2 (two) times daily.  Marland Kitchen HYDROcodone-homatropine (HYCODAN) 5-1.5 MG/5ML syrup Take 5 mLs by mouth every 6 (six) hours as needed for cough. Every 4-6 hours as needed (Patient not taking: Reported on 11/06/2017)  . predniSONE (DELTASONE) 20 MG tablet Taper dose Take as directed  . [EXPIRED] levalbuterol (XOPENEX) nebulizer solution 0.63 mg    No facility-administered encounter medications on file as of 11/06/2017.     No Known Allergies    Immunization History  Administered Date(s) Administered  . H1N1 07/01/2008  . Influenza Split 04/26/2011, 05/01/2012  . Influenza,inj,Quad PF,6+ Mos 08/17/2015, 08/21/2016, 06/09/2017  . Pneumococcal Conjugate-13 02/17/2015  . Pneumococcal Polysaccharide-23 07/01/2008  . Td 12/22/2009  . Tdap 04/26/2011    Current Medications, Allergies, Past Medical History, Past Surgical History, Family History, and Social History were reviewed in [employer] record.     Review of Systems        Chananya is remarkably devoid of  symptoms- CC is nasal congestion and drainage, min cough w/ clear sput.  The patient denies fever, chills, sweats, anorexia, fatigue, weakness, malaise, weight loss, sleep disorder, blurring, diplopia, eye irritation, eye discharge, vision loss, eye pain, photophobia, earache, ear discharge, tinnitus, decreased hearing, nasal congestion, nosebleeds, sore throat, hoarseness, chest pain, palpitations, syncope, orthopnea, PND, peripheral edema, dyspnea at rest, excessive sputum, hemoptysis, wheezing, pleurisy, nausea, vomiting, diarrhea, constipation, change in bowel habits, abdominal pain, melena, hematochezia, jaundice, gas/bloating, indigestion/heartburn, dysphagia, odynophagia, dysuria, hematuria, urinary frequency, urinary hesitancy, nocturia, incontinence, back pain, joint pain, joint swelling, muscle cramps, muscle weakness, stiffness, arthritis, sciatica, restless legs, leg pain at night, leg pain with exertion, rash, itching, dryness, suspicious lesions, paralysis, paresthesias, seizures, tremors, vertigo, transient blindness, frequent falls, frequent headaches, difficulty walking, depression, anxiety, memory loss, confusion, cold intolerance, heat intolerance, polydipsia, polyphagia, polyuria, unusual weight change, abnormal bruising, bleeding, enlarged lymph nodes, urticaria, allergic rash, hay fever, and recurrent infections.     Objective:   Physical Exam    WD, WN, 62 y/o WM in NAD... GENERAL:  Alert & oriented; pleasant & cooperative... HEENT:  [place]/AT, EOM-wnl, PERRLA, Fundi-benign, EACs-clear, TMs-wnl, NOSE-clear, THROAT-clear & wnl. NECK:  Supple w/ fairROM; no JVD; normal carotid impulses w/o bruits; no thyromegaly or nodules palpated; no lymphadenopathy. CHEST:  decr BS bilat, few bibasilar rhonchi w/o consolidation; without wheezes or rales heard... HEART:  Regular Rhythm; without murmurs/ rubs/ or gallops. ABDOMEN:  Soft & nontender; normal bowel sounds; no organomegaly or masses  detected. EXT: without deformities or arthritic changes; no varicose veins/ venous insuffic/ or edema. NEURO:  CN's intact; motor testing normal; sensory testing normal; gait normal & balance OK. DERM:  No lesions noted; no rash etc...  RADIOLOGY DATA:  Reviewed in the EPIC EMR & discussed w/ the patient...  LABORATORY  DATA:  Reviewed in the EPIC EMR & discussed w/ the patient...   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 Continue Ethan, Mucinex as needed, etc...  NEW CXR ABNORMALITY 2/18 w/ LUL peripheral abnormality that occurred in the wake of a LUL pneumonia from Oct2017- treated w/ Levaquin in [place];  He has had CT, LABS, PET => see above & decision made to continue close follow up w/ serial CXR/ scans...  01/28/17>    We reviewed the serial CT findings & our options;  he remains totally asymptomatic- active in home remodeling, climbing, crawling, working pretty hard w/o dyspnea etc;  In fact his CC is sinus drainage;  Options are to continue current course- no pipe, +Advair/ Spiriva, exercise program, avoid exposures and repeat CT Chest similar 9mo  Alternatively we could consider Bronch w/ Lavage for cytology & cultures, or [facility]... I presented case to DrNestor who agreed w/ the conservative approach at this point in light of no clear cut signs of worsening parenchymal dis...  06/09/17>   Haydn remains asymtomatic w/o much cough/ sputum/ SOB/ chest discomfort/ etc;  He is not limited in his work/ daily life/ ADLs in any way and he is not inclined to pursue a more aggressive posture for additional evaluation of his pulm process=> we will continue conservative management- he will work on all smoking (pipe) cessation, continue the Advair/ Spiriva, continue  exercise, etc... We plan rov in 3-445mo/ CXR & fasting blood work. 09/10/17>   We decided to treat ChJuanda Crumblesymptoms w/ ZPak/ Pred dosepak/ Hycodan and he will try to decr & quit the pipe smoking!!! LUL changes continue to evolve & I am concerned that he has an air-fluid level that could get infected- (but the norm CBC & Sed rate are reassuring); we will continue to monitor carefully & plan f/u CT Chest [id number].  11/06/17>   CT w/ ?worsening or ?superimposed LUL parenchymal process- no sput to culture, Afeb, Labs are ok w/ norm CBC/Chem panel but elev sed at 44; we decided to treat w/ AUGMENTIN 875Bid x 10d (cover w/ Align & Activia), plus a course of oral PRED- Depo120 today, then Pred'20mg'$  Bid x1wk, Qd x1wk, then '10mg'$  daily til ret in 4wks;  Rec Mucinex, water, Hycodan prn; call for any worsening in breathing;  NO SMOKING and take it easy until recheck appt   PIPE SMOKER>  ChCassiusnderstands that he must quit all smoking but doesn't seem able to quit the pipe!  Hx AtypCP>  No further chest discomfort; EKG w/ NSR, IVCD, NAD...  Borderline CHOL>  LDL not at goal but he is not inclined to take statin Rx; therefore diet alone...  GERD>  He has signif symptoms and NOT improved w/ Protonix; NEXIUM is the only PPI that works for him & we will re-write this med...  Colon Polyps>  As noted brother died age 61/ colon cancer; last colonoscopy 1/10 was ok & f/u due now- [facility]  PROSTATE CANCER> [place] & asked that we do yearly PSA; todays lab w/ PSA= zero... NOTE: hx one episode of micturition syncope w/o recurrence...  DJD>  Stable, uses OTC analgesics as needed...  Anxiety>  Aware, stable, no  meds required...   Patient's Medications  New Prescriptions   AMOXICILLIN-CLAVULANATE (AUGMENTIN) 875-125 MG TABLET    Take 1 tablet by mouth 2 (two) times daily.   PREDNISONE (DELTASONE) 20 MG TABLET    Taper dose Take as directed  Previous Medications    ASPIRIN 325 MG TABLET    Take 325 mg by mouth daily.     ESOMEPRAZOLE (NEXIUM) 40 MG CAPSULE    TAKE 1 CAPSULE BY MOUTH  DAILY AT 12 NOON.   FLUTICASONE-SALMETEROL (ADVAIR DISKUS) 250-50 MCG/DOSE AEPB    USE 1 INHALATION ORALLY  TWICE DAILY   HYDROCODONE-HOMATROPINE (HYCODAN) 5-1.5 MG/5ML SYRUP    Take 5 mLs by mouth every 6 (six) hours as needed for cough. Every 4-6 hours as needed   MULTIPLE VITAMIN (MULTIVITAMIN) TABLET    Take 1 tablet by mouth daily.     TADALAFIL (CIALIS) 5 MG TABLET    TAKE 1 TABLET BY MOUTH  DAILY   TIOTROPIUM (SPIRIVA HANDIHALER) 18 MCG INHALATION CAPSULE    INHALE THE CONTENTS OF 1  CAPSULE VIA HANDIHALER  DAILY  Modified Medications   No medications on file  Discontinued Medications   AZITHROMYCIN (ZITHROMAX) 250 MG TABLET    Take as directed   PREDNISONE (STERAPRED UNI-PAK 21 TAB) 5 MG (21) TBPK TABLET    Take as directed

## 2017-11-06 NOTE — Patient Instructions (Signed)
Today we updated your med list in our EPIC system...     Today we checked a follow up CXR, did an ambulatory oximetry test & some blood work... We will arrange for an urgent CT Chest to check your lungs...    We will contact you w/ the results when available...   We decided to treat you w/ an antibiotic to cover poss anearobic infection--    Start the AUGMENTIN 875mg  one tab twice daily for 10 days...    Be sure to take a PROBIOTIC like Align (OTC) daily + Activia yogurt...  We have also placed you on a tapering course of oral PREDNISONE for the inflammatory changes--    Start the Pred 20mg  tabs in the AM- one tab twice daily for 1 week...    Then decrease to one tab each AM for 1 week...    Then decrease to 1/2 tab each AM til return visit in mid-May (58month)  Continue your Advair/ Spiriva/ Mucinex/ Fluids...    BUT not the pipe, quit the pipe Juanda Crumble, please!    Call for any questions...  Let's plan a follow up visit in 50mo, sooner if needed for any acute issues.Marland KitchenMarland Kitchen

## 2017-12-15 ENCOUNTER — Ambulatory Visit: Payer: 59 | Admitting: Pulmonary Disease

## 2017-12-17 ENCOUNTER — Encounter: Payer: Self-pay | Admitting: Pulmonary Disease

## 2017-12-17 ENCOUNTER — Other Ambulatory Visit: Payer: Self-pay | Admitting: Pulmonary Disease

## 2017-12-17 ENCOUNTER — Ambulatory Visit (INDEPENDENT_AMBULATORY_CARE_PROVIDER_SITE_OTHER)
Admission: RE | Admit: 2017-12-17 | Discharge: 2017-12-17 | Disposition: A | Discharge status: Home / Self Care | Payer: 59 | Source: Ambulatory Visit | Attending: Pulmonary Disease | Admitting: Pulmonary Disease

## 2017-12-17 ENCOUNTER — Ambulatory Visit (INDEPENDENT_AMBULATORY_CARE_PROVIDER_SITE_OTHER): Payer: 59 | Admitting: Pulmonary Disease

## 2017-12-17 VITALS — BP 120/64 | HR 75 | Temp 97.5°F | Ht 73.0 in | Wt 177.8 lb

## 2017-12-17 DIAGNOSIS — F17290 Nicotine dependence, other tobacco product, uncomplicated: Secondary | ICD-10-CM | POA: Diagnosis not present

## 2017-12-17 DIAGNOSIS — J432 Centrilobular emphysema: Secondary | ICD-10-CM

## 2017-12-17 DIAGNOSIS — R9389 Abnormal findings on diagnostic imaging of other specified body structures: Secondary | ICD-10-CM | POA: Diagnosis not present

## 2017-12-17 DIAGNOSIS — J439 Emphysema, unspecified: Secondary | ICD-10-CM | POA: Diagnosis not present

## 2017-12-17 MED ORDER — PREDNISONE 10 MG PO TABS: ORAL_TABLET | ORAL | 2 refills | Status: DC

## 2017-12-17 MED ORDER — AZELASTINE HCL 0.1 % NA SOLN: 2.0000 | Freq: Two times a day (BID) | NASAL | 6 refills | Status: DC | PRN

## 2017-12-17 NOTE — Patient Instructions (Signed)
Today we updated your med list in our EPIC system...     We decided to slowly wean down the PREDNISONE 10mg  tabs--    Cut to one tab alternating w/ 1/2 tab every other day (10, 5, 10, 5, etc) for about 1 month...    Plan to give me a call & let me know how you are doing...    thenwe would plan to cut further down to 5mg  daily til return office visit in about 2 months or so...  We wrote for the [facility]-- good luck, you can do it!!!  Continue the Advair & Spiriva as always...  Call for any questions...  Let's plan a follow up visit in about 5mo, sooner if needed for problems.Marland KitchenMarland Kitchen

## 2017-12-17 NOTE — Progress Notes (Signed)
Subjective:    Patient ID: Fred [name], male    DOB: 12-10-1955, 62 y.o.   MRN: [id number]  HPI 62 y/o WM here for [name] follow up visit... he has multiple medical problems as noted below...  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 ~  SEE PREV EPIC NOTES FOR OLDER DATA >>   ~  May 01, 2102:  Fred [name] CPX> Fred [name] has had [name] good year, no new complaints or concerns except his insurance co difficulty w/ Nexium64m/d & he will check w/ Fred [name]'s & 30d vs 90d via his InsurCo...    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 CXR is unchanged...    Chol> on diet alone; he had recent business trip & is worried his diet wasn't what it should have been; FLP showed TChol 198, TG 99, HDL 47, LDL 131    GERD> on Nexium40; Protonix wasn't helpful & he is hopeful that his insurance co will cover the needed Nexium...    Colon Polyp & +FamHx colon ca> last colon 102/03/24w/ divertics & tiny polyp, Fred [name]/u due 1/15 and he remains asymptomatic...    Prostate cancer> s/p robotic prostatectomy 5/10 [name]; Labs today showed PSA= 0.00 We reviewed prob list, meds, xrays and labs> see below for updates >> OK 2013 Flu vaccine today...  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  LABS 10/13:  FLP- ok x LDL=131 on diet alone;  Chems- wnl;  CBC- wnl;  TSH=0.79;  PSA=0.00;  Urine- clear...  ~  November 03, 2013:  62moOV & Fred [name] reports [name] good interval w/o new complaints or concerns; getting regular exercise (retired) and feeling well- he notes some troublesome sinus  drainage and recent "cold" for which we called in [name] Pred dosepak but he notes min change; we discussed Rx w/ OTC Antihist, Nasal saline mist Q1-2h, Mucinex- 2Bid, Fluticasone 2sp each nostril Qhs, and Rx for Astelin- 2sp each nostril Bid as needed; he still smokes [name] pipe regularly & again asked to quit! [facility]... We reviewed the following medical problems during today's office visit >>     COPD, Emphysematous bleb, s/p RUL bullectomy 1997 for infected bleb w/ [name]/Fred [name] level, pipe smoker> on Advair250, Spiriva; no intercurrent infections, min cough & sput, no hemoptysis, stable DOE w/o change, no edema, etc; he reports cut back on pipe smoking to ~50% of former level & asked to decr further & work on smoking cessation! CXR is stable, unchanged...    Chol> on diet alone & his weight is good; FLP 4/15 showed TChol 174, TG 55, HDL 43, LDL 120; he does not want meds despite not being optimal, reviewed diet...    GERD> on Nexium40; Protonix wasn't helpful; he denies adb pain, dysphagia, n/v, c/d, blood seen...     Colon Polyp & +FamHx colon ca (bro died at 52)> last colon 07/2022-02-03/ divertics & tiny polyp, Fred [name]/u due 1/15 and he remains asymptomatic (he will get this  in [name])...    Prostate cancer> s/p robotic prostatectomy 5/10 & Fred [name] in [name] released him 9/12 & asked [name] to monitor PSA yearly; Labs today showed PSA= 0.00 We reviewed prob list, meds, xrays and labs> see below for updates >> he did not get the 2014 Flu vaccine last fall & reminded to get this every yr at local pharm...  CXR 4/15 showed norm heart size, vol loss on right w/ pleuroparenchymal scarring, hyperinflation on left w/ bullous emphysema, stable- NAD...  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;  He reports breathing is doing OK & he had one infectious exac 2/16 (we called in meds); unfortunately he is still smoking [name] pipe "I enjoy it" but less tobacco than before he says; in addition he has been using his Spiriva "prn" as he freq forgets this dose-- we had [name] productive conversation about his bullous emphysema/ prev surg/ etc, I indicated that while I am delighted that he remains relatively asymptomatic at this time, that he is more rapidly approaching [name] life of deteriorating resp function w/ daily symptoms/ chronic resp failure/ need for oxygen 24/7, etc; he understands this & I have made yet another pitch for complete smoking cessation & taking his meds regularly day-in and day-out...     COPD, Emphysematous bleb, s/p RUL bullectomy 1997 for infected bleb w/ [name]/Fred [name] level, pipe smoker> on Advair250Bid, Spiriva daily; no intercurrent infections, min cough & sput, no hemoptysis, stable DOE w/o change, no edema, etc; he reports cut back on pipe smoking but must quit completely as above; CXR is stable, unchanged w/ chr changes on right & bullous dis on left...    Chol> on diet alone & his weight is good; FLP 7/16 showed TChol 154, TG 79, HDL 41, LDL 97 => improved, keep up the good work!    GERD> on Nexium40; Protonix wasn't helpful; he denies abd pain, dysphagia, n/v, c/d, blood seen; he tried off the Nexium- symptoms returned therefore back on the PPI daily...    Colon Polyp & +FamHx colon ca (bro died at 52)> last colon 101/14/24w/ divertics & tiny polyp, Fred [name]/u done in [place] in 2015, he reports neg & [place]    Prostate cancer> s/p robotic prostatectomy 5/10 & [name] PSA yearly; Labs today showed PSA= 0.00, requests Cialis for daily use. We reviewed prob list, meds, xrays and labs> see below for updates >> Given PREVNAR-13 today...  CXR  02/17/15 showed norm heart size, severe COPD w/ bullous changes on left & s/p surg/ scarring on right, NAD/ no change from 2015...  Spirometry 02/17/15 showed FVC=3.25 (64%), FEV1=1.82 (46%), %1sec=56, mid-flows reduced at 21% predicted; c/w mod airflow obstruction & GOLD Stage3 COPD (can't r/o superimposed restriction w/o LVs)...  LABS 02/17/15:  FLP- all parameters at goals on diet;  Chems- wnl;  CBC- wnl;  TSH=1.10;  PSA= 0.00    IMP/PLAN>>  PFT shows functional deterioration from last studies, now GOLD Stage3, CXR unchanged, Labs OK;  He knows the score & must quit all smoking, take meds every day, try  to avoid infections, increase exercise program..   ~  September 25, 2016:  25moROV & CPX>  CNickalausreports doing well & only recalls one resp exac since last visit- records in Epic/ CareEverywhere reviewed>>    He called here 03/21/16 c/o nasal congestion, chest tight, cough w/ clear mucus;  We called in Pred dosepak, Mucinex, nasal saline...    He called back 8/31 c/o persistent cough (now nonproductive) chest tight/ SOB, denied Fred [name]/c/s;  Asked to go to PCP vs UC for exam & to be checked...    [facility]> c/o cough & cold-like sx for 1wk, T101.5, CXR showed LUL pneumonia, given IM-Rocephin, Levaquin500 x10d, Tussionex...    He was Fred [name]/u PCP-[facility] on 9/5> c/o persist fever/ cough/ congestion but feeling sl better; Temp 100.8 &he was given Rocephin IM, continue Levaquin...    He was rechecked 9/7> clinically improved, T98.9, given 10 more days of Levaquin500...    Pt returned 10/13> c/o 1wk hx recurrent cough, sputum, congestion & Dx w/ bronchitis=> given another 10d Levaquin500, no Fred [name]/u CXR done. Currently he notes [name] mild "cold" w/ min cough, no sput, no hemoptysis, no Fred [name]/c/s, & stable SOB/DOE esp if going uphill, pushing wheelbarrow, etc... 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 we reviewed the following medical problems during today's office visit >>     COPD, Emphysematous bleb, s/p RUL bullectomy 1997 for infected bleb w/ [name]/Fred [name] level, pipe smoker> on Advair250Bid, Spiriva daily; min cough & sput, no hemoptysis, stable DOE w/o change, no edema, etc; he reports cut back on pipe smoking but must quit completely as above; CXR was stable x yrs w/ chr changes on right & bullous dis on left; then Fred [name]/u CXR 09/25/16 showed new LUL lesion => CT pending.    New LUL peripheral lesion/ CXR abnormality> found on routine CXR 09/25/16 => work-up in progress w/ CT Chest pending...    Chol> on diet alone & his weight is good; FLP 2/18 showed TChol 164, TG 71, HDL 42, LDL 108 => stable & doing satis on diet...    GERD> on Nexium40; Protonix wasn't helpful; he denies abd pain, dysphagia, n/v, c/d, blood seen; he tried off the Nexium- symptoms returned therefore back on the PPI daily...    Colon Polyp & +FamHx colon ca (bro died at 52)> colon 101/19/2024w/ divertics & tiny polyp, Fred [name]/u done in [name] in 2015, he reports neg & will get [place]    Prostate cancer> s/p robotic prostatectomy 5/10 & [name] & [place] PSA yearly; Labs today showed PSA= 0.00, requests Cialis for daily use. EXAM shows Afeb, VSS, O2sat=99% on RA;  HEENT- neg, mallampati1;  Chest- decr BS but clear w/o w/r/r;  Heart- RR gr1/6 SEM w/o r/g;  Abd- soft, nontender, neg;  Ext- neg w/o c/c/e;  Neuro- intact w/o focal abn...  CXR 09/25/16>  Norm heart size, new pleuroparenchymal thickening & nodular density in left apex measuring about 2cm, increased interstitial markings bilat, emphysema & bullous changes bilat...  EKG 09/25/16>  NSR, rate62, IVCD, NAD...  LABS 09/25/16>  FLP- ok w/ LDL=108 on diet alone;  Chems- wnl;  CBC- wnl;  TSH=1.27;  PSA=0.00    IMP/PLAN>>  CKarmelohas [name] new abn on his Fred [name]/u CXR in the left apex=> needs CT Chest ASAP for further eval &  we will sched;   In the meanwhile we (again) reviewed the need to quit all smoking (he still smokes [name] pipe) and take meds regularly- Advair250Bid & Spiriva daily;  He needs to incr his exercise in [name] gradual exercise program as well;  We gave him the 2017 FLU vaccine today & [name] prescription for [name] ZPak to keep on hand.  ADDENDUM>>  CT CHEST w/ contrast 10/02/16 showed norm heart size, atherosclerosis Ao & L-Circ, norm caliber PAs & no PE seen; mod centrilob & paraseptal emhysema w/ bullous changes bilat & s/p RUL wedge resection w/ post surg scarring; thick walled irreg 6.5 x 3.7 cm cavitary lesion in LUL apical-post;  2 small nodules 4-45m size in lingula and RML noted; two 1.0-1.2cm nodes (L mediastinal & R hilar); s/pGB & 2cm R-renal cyst... NOTE: we discussed CT results and options=> decided to proceed w/ PET scan...  ADDENDUM>>  PET scan 10/23/16 showed [name] cavitary left apical process w/ nodularity along its inferior border tracking along the pleural surface (similar appearance to the recent CT Chest)- max assoc SUV is along this inferior pleural nodularity w/ max SUV=5.8;  Underlying emphysema, calcif biapical scarring R>L;  approx 6 x 2 cm periph density LLL posteromedially w/ max SUV=2.2 & likely benign;  Right hilar adenopathy is faintly hypermet w/ max SUV=3.0;  Incidental focal metabolic actiivity along the cecum w/ SUV max=9.7 (tumor cannot be excluded, no adenopathy apprec in this area)=> we will refer to his GI...  I reviewed these scans w/ Radiology & their IR team=> they felt that needle bx LUL could be done but carried [name] signif risk of pneumothorax & they suggested watchful waiting w/ repeat CT Chest in 3-4 months;  I called Pt & wife to review all this & they are in agreement w/ conservative approach & repeat scan in [id number] planned;  They know to call me for any problems, CTaelynwill call his gastroenterologist for OV to discuss PET scan & address ?need for repeat colon at this time...    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 He remains on ADVAIR250Bid & SPIRIVA once daily, uses Mucinex prn, and has had ZPak & Cipro given in the interim;  He still smokes the pipe- "I'm cutting back every day" & I implored him to quit completely... we reviewed the following medical problems during today's office visit >>     COPD, Emphysematous blebs, s/p RUL bullectomy 1997 for infected bleb w/ [name]/Fred [name] level, pipe smoker> on Advair250Bid, Spiriva daily; min cough & sput, no hemoptysis, stable DOE w/o change, no edema, etc; he reports cut back on pipe smoking but must quit completely as above; CXR was stable x yrs w/ chr changes on right & bullous dis on left; then [place], & [place] [name]/u CXR here 09/25/16 showed new LUL abnormality =>  CT Chest, subseq PET scan, and serial CT scans as documented...    Hx LUL pneumonia [id number] w/ residual LUL parenchymal abnormality> clinically pt is feeling & breathing well, at baseline, w/o much cough/ sputum/ etc; we considered bronch & lavage- following serial scans for now...    Chol> on diet alone & his weight is good; FLP 2/18 showed TChol 164, TG 71, HDL 42, LDL 108 => stable & doing satis on diet...    GERD> on Nexium40; Protonix wasn't helpful; he denies abd pain, dysphagia, n/v, c/d, blood seen; he tried off the Nexium- symptoms returned therefore back on the PPI daily...    Colon Polyp & +FamHx colon ca (bro died  at 52)> colon August 24, 2022 w/ divertics & tiny polyp, Fred [name]/u done in [place] in 2015, he reports neg & will get copies sent to [name]; PET scan 09/2016 for lung abn showed focal metabolic activ along the cecum (SUV 9.7) & he had Fred [name]/u w/ DrSingh in [place] w/ repeat colon showing 1 sm polyp, otherw neg...    Prostate cancer> s/p robotic prostatectomy 5/10 & [place] [name] in [place] released him 9/12 & asked [name] to monitor PSA yearly; Labs 2/18 showed PSA= 0.00, requests Cialis for daily use. EXAM shows Afeb, VSS, O2sat=98% on RA;  HEENT- neg, mallampati1;  Chest- decr BS but clear w/o w/r/r;  Heart- RR gr1/6 SEM w/o r/g;  Abd- soft, nontender, neg;  Ext- neg w/o c/c/e;  Neuro- intact w/o focal abn...  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 I presented case to DrNestor who agreed w/ the conservative approach at this point in light of no clear cut signs of worsening parenchymal dis... We will recheck pt in Nov w/ another CT Chest...  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 He is takingAdvair250Bid, Spiriva once daily, & he has [name] Zpak for prn use if nec...     Problem List as above- reviewed w/ pt>>  EXAM shows Afeb, VSS, O2sat=97% on RA;  HEENT- neg, mallampati1;  Chest- decr BS but clear w/o w/r/r;  Heart- RR gr1/6 SEM w/o r/g;  Abd- soft, nontender, neg;  Ext- neg w/o c/c/e;  Neuro- intact w/o focal abn...  CT Chest 06/09/17 (independently reviewed by me in the PACS system) shows norm heart size w/ Ao & coronary atherosclerotic calcif; no  adenopathy, biapical pleuroparenchymal scarring w/ bullous emphysema, thick-walled bullous lesion in left apex has [name] new [name]/Fred [name] level & findings are otherw unchanged from 01/28/17, [name] chr infectious process is favored...  IMP/PLAN>>  Fred [name] remains asymtomatic w/o much cough/ sputum/ SOB/ chest discomfort/ etc;  He is not limited in his work/ daily life/ ADLs in any way and he is not inclined to pursue [name] more aggressive posture for additional evaluation of his pulm process=> we will continue conservative management- he will work on all smoking (pipe) cessation, continue the Advair/ Spiriva, continue exercise, etc... We plan rov in 3-26mow/ CXR & fasting blood work...       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 No CP/ palpit/ edema; NOTE- he is still smoking [name] pipe despite all efforts to get him to quit...  We reviewed the following medical problems during today's office visit>       COPD, Emphysematous blebs, s/p RUL bullectomy 1997 for infected bleb w/ [name]/Fred [name] level> on Advair250Bid, Spiriva daily; min cough & sput, no hemoptysis, stable DOE w/o change, no edema, etc; he reports cut back on pipe smoking but must quit completely as above; CXR was stable x yrs w/ chr changes on right & bullous dis on left; then he [place], & Fred [name]/u CXR here 09/25/16 showed new LUL abnormality => CT Chest, subseq PET scan, and serial CT scans as documented...    Hx LUL pneumonia [id number]/ residual LUL parenchymal abnormality & several small scat bilat pulm nodules on CT Chest > clinically pt is feeling & breathing well, at baseline, w/o much cough/ sputum/ etc; we considered bronch & lavage- but pt preferred following serial scans for now...    PIPE SMOKER>  Despite his best efforts- he has been unable to quit the pipe smoking...    Coronary & aortic atherosclerosis on CT Chest>  EKGs show NSR, IVCD, ?early repol, no acute changes...     Chol> on diet alone & his weight is good; FLP 2/18 showed TChol 164, TG 71, HDL 42, LDL 108 => stable & doing satis on diet...    GERD> on Nexium40; Protonix wasn't helpful; he denies abd pain, dysphagia, n/v, c/d, blood seen; he tried off the Nexium- symptoms returned therefore back on the PPI daily...    Colon Polyp & +FamHx colon ca (bro died at 52)> colon 1/01-14-24/ divertics & tiny polyp, Fred [name]/u done in [place] in 2015, he reports neg & [place] for lung abn showed focal metabolic activ along the cecum (SUV 9.7) & [name] showing 1 sm polyp, otherw neg...    Prostate cancer> s/p robotic prostatectomy 5/10 & [name] & [place]; Labs 2/18 showed PSA= 0.00, requests Cialis for daily use. EXAM shows Afeb, VSS, O2sat=97% on RA;  HEENT- neg, mallampati1;  Chest- decr BS but clear w/o w/r/r;  Heart- RR  gr1/6 SEM w/o r/g;  Abd- soft, nontender, neg;  Ext- neg w/o c/c/e;  Neuro- intact w/o  focal abn...  CXR 09/10/17 (independently reviewed by me in the PACS system) showed norm heart size, COPD/ emphysema w/ biapical bullous formation L>R w/ air-fluid level in bleb...  LABS 09/10/17>  FLP- ok on diet alone;  Chems- wnl;  CBC- wnl;  TSH=1.38;  PSA=0.00;  Sed=12  IMP/PLANS>>  We decided to treat Fred [name]' symptoms w/ ZPak/ Pred dosepak/ Hycodan and he will try to decr & quit the pipe smoking!!! LUL changes continue to evolve & I am concerned that he has an air-fluid level that could get infected (but the norm CBC & Sed rate are reassuring); we will continue to monitor carefully & plan Fred [name]/u CT Chest [id number]...   ~  November 06, 2017:  62moROV & add-on appt requested for [name] several week hx of incr cough, small amt of yellow sputm production, nasal & chest congestion w/ sneezing, drainage, incr SOB & chest tightness;  He thinks poss low grade fever (didn't take temp but hot/cold w/ chills), denies CP, no hemoptysis; he has remained regular w/ his Advair250Bid & Spiriva daily, he has Hycodan to use as needed; unfortunately he continues to smoke his pipe & did so on the way to [place] this [place] [facility] [name]..     He has known bullous emphysema w/ hx infected RUL bleb w/ air-fluid level that required surg in 1997; hx normal A1AT level & MM phenotype, neg fam hx emphysema or pulm problems; persistent pipe smoker; he had been stable w/ his baseline GOLD Stage 3 COPD/emphysema (FEV1=1.82, 46%predicted) until the Fall of 2017 when [facility] but w/ signif changes in his CXR & CTChest showing worsening LUL dis w/ pleuro-parenchymal changes and [name] smaller [name]/Fred [name] level seen;  We treated him w/ additional antibiotics and Prednisone & have been following this conservatively w/ CXR/ scans/ etc...     EXAM shows Afeb, VSS, O2sat=98% on RA;  HEENT- neg, mallampati1;  Chest- decr BS  w/ few bibasilar rhonchi, no wheezing/ rales/ or consolidation;  Heart- RR gr1/6 SEM w/o r/g;  Abd- soft, nontender, neg;  Ext- neg w/o c/c/e;  Neuro- intact w/o focal abn...  CXR 11/06/17>  (independently reviewed by me in the PACS system) showed baseline COPD/emphysema w/ LUL bullous lesion w/ ?larger [name]/Fred [name] level & incr markings in LUL- ant & central regions, suggest Fred [name]/u CT for better delineation...   Given NEB treatment in office but unable to expectorate any sput for cultures...  Ambulatory Oximetry 11/06/17>  O2sat=100% on RA at rest w/ pulse=80/min;  Pt ambulated 3 laps in the office (185'each) w/ lowest O2sat=98% w/ pulse=92/min... No desaturations.  LABS 11/06/17>  Chems- wnl w/ BS=92, Cr=0.81, LFTs wnl etc;  CBC- wnl w/ Hg=13.9, WBC=9.4 w/ norm diff;  Sed=44  CT Chest 11/06/17>  Norm heart size, coronary & Ao atherosclerosis, norm caliber pulm arteries; mod centrilob & paraseptal emphysema w/ diffuse bronch wall thickening, postsurg changes from RUL apical bleb resection; thick walled post left apical/LUL bullous lesion w/ [name]/Fred [name] level similar to 06/09/17 scan; scat LUL nodules stable from priors and considered benign; new patchy consolidation & GGO in LUL ant & central area w/ tree-in-bud opac- ?progression of chr infections vs new superimposed infectious process; area fo left pleural thickening w/o effusion...  IMP/PLAN>>  CT w/ ?worsening or ?superimposed LUL parenchymal process- no sput to culture, Afeb, Labs are ok w/ norm CBC/Chem panel but elev sed at 44; we decided to treat w/ AUGMENTIN 875Bid  x 10d (cover w/ Align & Activia), plus [name] course of oral PRED- Depo120 today, then Pred66m Bid x1wk, Qd x1wk, then 125mdaily til ret in 4wks;  Rec Mucinex, water, Hycodan prn; call for any worsening in breathing;  NO SMOKING and take it easy until recheck appt...   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        He has known bullous emphysema w/ hx infected RUL bleb w/ air-fluid level that required surg in 1997; hx normal A1AT level & MM phenotype, neg fam hx emphysema or pulm problems; persistent pipe smoker; he had been stable w/ his baseline GOLD Stage 3 COPD/emphysema (FEV1=1.82, 46%predicted) until the Fall of 2017 when [facility] but w/ signif changes in his CXR & CTChest showing worsening LUL bullous dis w/ pleuro-parenchymal changes and [name] small [name]/Fred [name] level seen;  We treated him w/ additional antibiotics and Prednisone & have been following this conservatively w/ CXR/ scans/ etc...     EXAM shows Afeb, VSS, O2sat=95% on RA;  HEENT- neg, mallampati1;  Chest- decr BS bilat w/o w/r/r/ or consolidation;  Heart- RR gr1/6 SEM w/o r/g;  Abd- soft, nontender, neg;  Ext- neg w/o c/c/e;  Neuro- intact w/o focal abn...  CXR 12/17/17 (independently reviewed by me in the PACS system) shows norm heart soze, COPD, emphysema, left apical bullous change w/ air fluid level similar to prior- NAD... IMP/PLAN>>  Fred [name] clinically improved and back to baseline, unfortunately still smoking his pipe but he requests Rx for CHANTIX & says he'll try it!  CXR shows similar chronic thick-walled LUL bullous lesion & [name]/Fred [name] level and parenchymal scarring but no evid of pneumonia or air-space dis;  Pred has weaned down to 64m78mm & we discussed slow further taper to 64mg74m w/ 5mg Q54mfor the next month, then asked him to call me w/ update, & would anticipate weaning further to 5mg/d 68mer that w/ ROV in about 15mo... 77mo       Problem List:  ALLERGY  (ICD-995.3) - uses OTC antihistamines Prn...  COPD (ICD-496) & EMPHYSEMATOUS BLEB (ICD-492.0) - on ADVAIR [place]. unfortunately he still smokes [name] pipe daily (w/ min cough, drainage, etc)... he has [name] neg FamHx of lung disease and [name] normal A1AT level (MM phenotype)... 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 ~  baseline CXR's back to 1990 showed large bilat bullae R>L & scarring at left base... ~  Fred [name]/u CXR's w/ vol loss on right from the surg, bullae on the left, scarring, etc... ~  prev CT Chest revealed mult blebs & bullae in RUL & LUL to the lingular w/ scarring...  ~  1997= 14cm RUL bulla w/ AF level (required VATS Bullectomy- path= emphysema, infected bulla w/ acute/ chr inflamm). ~  CT Chest 10/09 in [place]= similar bullous dis on left w/ emphysema, post-op changes on right, NAD,etc. ~  PFT 6/00 showed  FVC= 4.41 (84%), FEV1= 2.53 (59%), %1sec= 57, mid-flows= 25%pred... ~  PFT 6/06 showed FVC= 4.59 (87%), FEV1= 2.79 (65%), %1sec= 61, mid-flows= 27%pred... ~  CXR 5/11 showed advanced chr lung dis w/ hyperinflation, scarring, decr vol on right> NAD. ~  PFT 9/12 showed FVC= 4.07 (79%), FEV1= 2.34 (57%), %1sec= 57, mid-flows= 24%pred... c/w GOLD Stage2 COPD- MUST QUIT ALL SMOKING! ~  CXR 9/12 showed no signif change in his bullous lung dis & evid of prev surg on the right... Asked again to quit the pipe. 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 Must quit all smoking! 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 ~  Spirometry 02/17/15 showed FVC=3.25 (64%), FEV1=1.82 (46%), %1sec=56, mid-flows reduced at 21% predicted; c/w mod airflow obstruction & GOLD Stage3 COPD (can't r/o superimposed  restriction w/o LVs)... ~  CXR 09/25/16>  Norm heart size, new pleuroparenchymal thickening & nodular density in left apex measuring about 2cm, increased interstitial markings bilat, emphysema & bullous changes bilat => proceed w/ CT Chest.  ABN CXR w/ LUL peripheral lesion found on routine CXR 09/25/16=> eval in progress... ~  He had LUL pneumonia treated by PCP in [facility] during the Fall2017 but this was not followed up by them...   Hx of CHEST PAIN, ATYPICAL (ICD-786.59) - on ASA 68m/d... hx intermittent chest wall tightness/ spasm/ ?palpit/ numb in left arm- last 2-3 min and resolves spont, not exercise induced... s/p cardiac eval 2/08 by DrCooper- he wanted to do treadmill and holter but pt never did these and states he is doing fine w/o any incr in symptoms... ~  EKG 5/11 showed NSR 74/min, IVCD, NAD (no ch from old tracings) ~  EKG 9/12 showed NSR 68/min, IVCD, NAD & no change...  HYPERCHOLESTEROLEMIA, MILD (ICD-272.0) - on diet alone... ~  [place] showed TChol 182, TG 80, HDL 36, LDL 130... he prefers diet Rx. ~  [place]12/09 showed TChol 172, TG 81, HDL 36, LDL 120... rec- improve diet efforts, or consider statin. ~  FLP 5/11 showed TChol 169, TG 94, HDL 37, LDL 113... continue diet efforts. ~  FLP 9/12 showed TChol 172, TG 52, HDL 43, LDL 118 ~  FLP 10/13 on diet alone showed TChol 198, TG 99, HDL 47, LDL 131... Offered Crestor58md, he will decide. ~  FLP 4/15 on diet alone showed TChol 174, TG 55, HDL 43, LDL 120... He prefers diet/ exercise & declined low dose statin rx. ~  FLP 7/16 on diet alone showed TChol 154, TG 79, HDL 41, LDL 97 ~  FLP 2/18 on diet alone showed TChol 164, TG 71, HDL 42, LDL 108  GERD (ICD-530.81) - on NEXIUM 4074m... he states he tried off PPI meds but had to restart due to signif heartburn... ~  9/12:  He reports that insurance co required switch off Nexium onto Protonix but the latter isn't working w/ return of severe reflux symptoms & he wants to restart the  NEXC[place]villem88m.. ~  10/13:  Doing satis back on the Nexium, but trouble w/ coverage thru his insurance... ~  4/15:  He continues stable on the Nexium40...  COLONIC POLYPS (ICD-211.3) & Family Hx of ADENOCARCINOMA, COLON (ICD-153.9)  - colonoscopy 7/01  by DrPatterson showed diminutive rectal polyp= hyperplastic... Fred [name]/u planned 43yr but pt cancelled and never rescheduled the procedure... Fred [name]/u colonoscopy done 1/10 showed divertics & tiny polyp w/o adenomatous change (+FamHx of colon cancer in brother who died of the disease at age 61... Repeat due 1/15. ~  4/15:  He knows that he is due for Fred [name]/u colonoscopy & [name] he tells me this was done, we do not have record.  ADENOCARCINOMA, PROSTATE (ICD-185) >> ~  5/11:  when seen 12/09 his routine PSA was 5.98 & [facility] +prostate cancer and s/p robotic prostatectomy 5/10 & doing well so far w/ PSA <0.01 on last check 11/10 w/ Fred [name]/u due soon (Q624mo ~  He had episode of micturition syncope that occurred one night- awoke w/ full bladder & was standing to urinate, then fell w/ transient syncope, injured tooth & fx rib; recovered uneventfully & he is asked to sit down to empty bladder, stand slowly & be careful> no recurrence. ~  9/12:  He [place]" & he wants [place]ore[place] do yearly PSA Fred [name]/u here; PSA today = zero... ~  10/13:  Clinically stable w/o voiding difficulty, incont, etc; PSA= 0.00 ~  4/15:  He remains clinically stable w/o complaints and Fred [name]/u PSA remains zero... ~  7/16:  PSA remains 0.00, he is asking about Cialis for daily use => given Rx.  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  ANXIETY (ICD-300.00)  Health Maintenance >> ~  GI:  His brother died age [id number]/ colon cancer; pt had hyperplastic polyp in past; last colonoscopy was 1/10 by DrPatterson- neg, Fred [name]/u planned 5y17yr~  GU:  Hx  [place]state cancer [place] 2010 [name]10 & doing well since then; PSA= zero ~  Immuniz:  He had PNEUMOVAX in 1999, and gets yearly flu shots... Pneumovax Fred [name]/u vaccination given 12/09... Given TDAP 5/11   Past Surgical History:  Procedure Laterality Date  . CHOLECYSTECTOMY  2004  . OTHER SURGICAL HISTORY     S/P VATS w/ infected bleb removed from RUL 1997  . PROSTATECTOMY  11/2008   S/P robotic prostatectomy in [name] Encounter Medications as of 12/17/2017  Medication Sig  . aspirin 325 MG tablet Take 325 mg by mouth daily.    . eMarland Kitchenomeprazole (NEXIUM) 40 MG capsule TAKE 1 CAPSULE BY MOUTH  DAILY AT 12 NOON.  . FMarland Kitchenuticasone-Salmeterol (ADVAIR DISKUS) 250-50 MCG/DOSE AEPB USE 1 INHALATION ORALLY  TWICE DAILY  . Multiple Vitamin (MULTIVITAMIN) tablet Take 1 tablet by mouth daily.    . predniSONE (DELTASONE) 10 MG tablet Taking 1 tab daily  . tadalafil (CIALIS) 5 MG tablet TAKE 1 TABLET BY MOUTH  DAILY  . tiotropium (SPIRIVA HANDIHALER) 18 MCG inhalation capsule INHALE THE CONTENTS OF 1  CAPSULE VIA HANDIHALER  DAILY  . azelastine (ASTELIN) 0.1 % nasal spray Place 2 sprays into both nostrils 2 (two) times daily as needed for rhinitis. Use in each nostril as directed  . HYDROcodone-homatropine (HYCODAN) 5-1.5 MG/5ML syrup Take 5 mLs by mouth every 6 (six) hours as needed for cough. Every 4-6 hours as needed (Patient not taking: Reported on 11/06/2017)  . [DISCONTINUED] amoxicillin-clavulanate (AUGMENTIN) 875-125 MG tablet Take 1 tablet by mouth 2 (two) times daily.    No Known Allergies  Immunization History  Administered Date(s) Administered  . H1N1 07/01/2008  . Influenza Split 04/26/2011, 05/01/2012  . Influenza,inj,Quad PF,6+ Mos 08/17/2015, 08/21/2016, 06/09/2017  . Pneumococcal Conjugate-13 02/17/2015  . Pneumococcal Polysaccharide-23 07/01/2008  . Td 12/22/2009  . Tdap 04/26/2011    Current Medications, Allergies, Past Medical  History, Past Surgical History, Family History, and Social History were reviewed in [employer] record.     Review of Systems        [place] is remarkably devoid of symptoms- CC is nasal congestion and drainage, min cough w/ clear sput.  The patient denies fever, chills, sweats, anorexia, fatigue, weakness, malaise, weight loss, sleep disorder, blurring, diplopia, eye irritation, eye discharge, vision loss, eye pain, photophobia, earache, ear discharge, tinnitus, decreased hearing, nasal congestion, nosebleeds, sore throat, hoarseness, chest pain, palpitations, syncope, orthopnea, PND, peripheral edema, dyspnea at rest, excessive sputum, hemoptysis, wheezing, pleurisy, nausea, vomiting, diarrhea, constipation, change in bowel habits, abdominal pain, melena, hematochezia, jaundice, gas/bloating, indigestion/heartburn, dysphagia, odynophagia, dysuria, hematuria, urinary frequency, urinary hesitancy, nocturia, incontinence, back pain, joint pain, joint swelling, muscle cramps, muscle weakness, stiffness, arthritis, sciatica, restless legs, leg pain at night, leg pain with exertion, rash, itching, dryness, suspicious lesions, paralysis, paresthesias, seizures, tremors, vertigo, transient blindness, frequent falls, frequent headaches, difficulty walking, depression, anxiety, memory loss, confusion, cold intolerance, heat intolerance, polydipsia, polyphagia, polyuria, unusual weight change, abnormal bruising, bleeding, enlarged lymph nodes, urticaria, allergic rash, hay fever, and recurrent infections.     Objective:   Physical Exam    WD, WN, 62 y/o WM in NAD... GENERAL:  Alert & oriented; pleasant & cooperative... HEENT:  [place]/AT, EOM-wnl, PERRLA, Fundi-benign, EACs-clear, TMs-wnl, NOSE-clear, THROAT-clear & wnl. NECK:  Supple w/ fairROM; no JVD; normal carotid impulses w/o bruits; no thyromegaly or nodules palpated; no lymphadenopathy. CHEST:  decr BS bilat, without wheezes, rales,  or rhonchi heard... HEART:  Regular Rhythm; without murmurs/ rubs/ or gallops. ABDOMEN:  Soft & nontender; normal bowel sounds; no organomegaly or masses detected. EXT: without deformities or arthritic changes; no varicose veins/ venous insuffic/ or edema. NEURO:  CN's intact; motor testing normal; sensory testing normal; gait normal & balance OK. DERM:  No lesions noted; no rash etc...  RADIOLOGY DATA:  Reviewed in the EPIC EMR & discussed w/ the patient...  LABORATORY DATA:  Reviewed in the EPIC EMR & discussed w/ the patient...   Assessment & Plan:    COPD/ Bullous Emphysema>  He is s/p RUL bullectomy1997 for an infected bulla w/ [name]/Fred [name] level; A1AT prev measured and wnl/ MM phenotype; PFT w/ mod airflow obstruction; must quit all smoking... Continue [place], Mucinex as needed, etc...  NEW CXR ABNORMALITY 2/18 w/ LUL peripheral abnormality that occurred in the wake of [name] LUL pneumonia from Oct2017- treated w/ Levaquin in [place];  He has had CT, LABS, PET => see above & decision made to continue close follow up w/ serial CXR/ scans...  01/28/17>    We reviewed the serial CT findings & our options;  he remains totally asymptomatic- active in home remodeling, climbing, crawling, working pretty hard w/o dyspnea etc;  In fact his CC is sinus drainage;  Options are to continue current course- no pipe, +Advair/ Spiriva, exercise program, avoid exposures and repeat CT Chest similar 16mo  Alternatively we could consider Bronch w/ Lavage for cytology & cultures, or [facility]... I presented case to DrNestor who agreed w/ the conservative approach at this point in light of no  clear cut signs of worsening parenchymal dis...  06/09/17>   Lonie remains asymtomatic w/o much cough/ sputum/ SOB/ chest discomfort/ etc;  He is not limited in his work/ daily life/ ADLs in any way and he is not inclined to pursue [name] more aggressive posture for additional evaluation  of his pulm process=> we will continue conservative management- he will work on all smoking (pipe) cessation, continue the Advair/ Spiriva, continue exercise, etc... We plan rov in 3-67mow/ CXR & fasting blood work. 09/10/17>   We decided to treat CJuanda [name] symptoms w/ ZPak/ Pred dosepak/ Hycodan and he will try to decr & quit the pipe smoking!!! LUL changes continue to evolve & I am concerned that he has an air-fluid level that could get infected- (but the norm CBC & Sed rate are reassuring); we will continue to monitor carefully & plan Fred [name]/u CT Chest [id number]..  11/06/17>   CT w/ ?worsening or ?superimposed LUL parenchymal process- no sput to culture, Afeb, Labs are ok w/ norm CBC/Chem panel but elev sed at 44; we decided to treat w/ AUGMENTIN 875Bid x 10d (cover w/ Align & Activia), plus [name] course of oral PRED- Depo120 today, then Pred'20mg'$  Bid x1wk, Qd x1wk, then '10mg'$  daily til ret in 4wks;  Rec Mucinex, water, Hycodan prn; call for any worsening in breathing;  NO SMOKING and take it easy until recheck appt 12/17/17>   CHarrie [name] clinically improved and back to baseline, unfortunately still smoking his pipe but he requests Rx for CHANTIX & says he'll try it!  CXR shows similar chronic thick-walled LUL bullous lesion & [name]/Fred [name] level and parenchymal scarring but no evid of pneumonia or air-space dis;  Pred has weaned down to '10mg'$  Qam & we discussed slow further taper to '10mg'$  alt w/ '5mg'$  Qod for the next month, then asked him to call me w/ update, & would anticipate weaning further to '5mg'$ /d after that w/ ROV in about 273mo PIPE SMOKER>  ChDavisnderstands that he must quit all smoking but doesn't seem able to quit the pipe!  Hx AtypCP>  No further chest discomfort; EKG w/ NSR, IVCD, NAD...  Borderline CHOL>  LDL not at goal but he is not inclined to take statin Rx; therefore diet alone...  GERD>  He has signif symptoms and NOT improved w/ Protonix; NEXIUM is the only PPI that works for him & we will re-write this  med...  Colon Polyps>  As noted brother died age 61/ colon cancer; last colonoscopy 1/10 was ok & Fred [name]/u due now- [facility]  PROSTATE CANCER> [name] & asked that we do yearly PSA; todays lab w/ PSA= zero... NOTE: hx one episode of micturition syncope w/o recurrence...  DJD>  Stable, uses OTC analgesics as needed...  Anxiety>  Aware, stable, no meds required...   Patient's Medications  New Prescriptions   AZELASTINE (ASTELIN) 0.1 % NASAL SPRAY    Place 2 sprays into both nostrils 2 (two) times daily as needed for rhinitis. Use in each nostril as directed  Previous Medications   ASPIRIN 325 MG TABLET    Take 325 mg by mouth daily.     ESOMEPRAZOLE (NEXIUM) 40 MG CAPSULE    TAKE 1 CAPSULE BY MOUTH  DAILY AT 12 NOON.   FLUTICASONE-SALMETEROL (ADVAIR DISKUS) 250-50 MCG/DOSE AEPB    USE 1 INHALATION ORALLY  TWICE DAILY   HYDROCODONE-HOMATROPINE (HYCODAN) 5-1.5 MG/5ML SYRUP    Take 5 mLs  by mouth every 6 (six) hours as needed for cough. Every 4-6 hours as needed   MULTIPLE VITAMIN (MULTIVITAMIN) TABLET    Take 1 tablet by mouth daily.     TADALAFIL (CIALIS) 5 MG TABLET    TAKE 1 TABLET BY MOUTH  DAILY   TIOTROPIUM (SPIRIVA HANDIHALER) 18 MCG INHALATION CAPSULE    INHALE THE CONTENTS OF 1  CAPSULE VIA HANDIHALER  DAILY  Modified Medications   Modified Medication Previous Medication   PREDNISONE (DELTASONE) 10 MG TABLET predniSONE (DELTASONE) 10 MG tablet      Alternate 1 tab one day and 1/2 tab the next    Take 10 mg by mouth as directed. Take as directed  Discontinued Medications   AMOXICILLIN-CLAVULANATE (AUGMENTIN) 875-125 MG TABLET    Take 1 tablet by mouth 2 (two) times daily.   PREDNISONE (DELTASONE) 20 MG TABLET    Taper dose Take as directed

## 2018-01-19 ENCOUNTER — Encounter: Payer: Self-pay | Admitting: Pulmonary Disease

## 2018-01-19 NOTE — Telephone Encounter (Signed)
SN please advise on patients e-mail below. Thank you.  

## 2018-01-20 MED ORDER — BUPROPION HCL ER (SR) 150 MG PO TB12: ORAL_TABLET | ORAL | 3 refills | Status: DC

## 2018-01-20 NOTE — Addendum Note (Signed)
Addended by: Len Blalock on: 01/20/2018 03:18 PM   Modules accepted: Orders

## 2018-01-23 ENCOUNTER — Telehealth: Payer: Self-pay | Admitting: Pulmonary Disease

## 2018-01-23 MED ORDER — AMOXICILLIN-POT CLAVULANATE 875-125 MG PO TABS: 1.0000 | ORAL_TABLET | Freq: Two times a day (BID) | ORAL | 0 refills | Status: DC

## 2018-01-23 NOTE — Telephone Encounter (Signed)
Copied from Pt e-mail:   Fred [name],  I hate to bother you but this cough is getting a little worse and wetter. Especially in the mornings. I am coughing up clear mucus in the mornings. I don't want to let it go and have to have one of your shots again. (LOL)  Could you ask dr Lenna Gilford if he could call in something for me as a prevention? I hate to celebrate my birthday and the 4th feeling bad.  Thanks in advance,  Fred [name]   Cb is [place], [place]

## 2018-01-23 NOTE — Telephone Encounter (Signed)
Per SN- Prednisone back to 10mg  every AM and to stay on this until return OV.  Augmentin 875mg  #20, 1 by mouth, BID til gone, remember to take align, activia yogurt.  May refill Hycodan if needed. Remind him to stop smoking. Called and spoke to Patient. Recommendations given, stated understanding.  Patient stated that he did not need refill for Hycodan at this time. Augmentin 875mg  #20 sent to requested pharmacy, [place], [place], [place]. Nothing further needed.

## 2018-02-11 ENCOUNTER — Encounter: Payer: Self-pay | Admitting: Pulmonary Disease

## 2018-02-11 ENCOUNTER — Ambulatory Visit (INDEPENDENT_AMBULATORY_CARE_PROVIDER_SITE_OTHER): Payer: 59 | Admitting: Pulmonary Disease

## 2018-02-11 VITALS — BP 126/74 | HR 65 | Ht 72.0 in | Wt 183.0 lb

## 2018-02-11 DIAGNOSIS — J432 Centrilobular emphysema: Secondary | ICD-10-CM

## 2018-02-11 DIAGNOSIS — J439 Emphysema, unspecified: Secondary | ICD-10-CM | POA: Diagnosis not present

## 2018-02-11 DIAGNOSIS — F17290 Nicotine dependence, other tobacco product, uncomplicated: Secondary | ICD-10-CM

## 2018-02-11 DIAGNOSIS — R9389 Abnormal findings on diagnostic imaging of other specified body structures: Secondary | ICD-10-CM | POA: Diagnosis not present

## 2018-02-11 MED ORDER — FLUTTER DEVI: 0 refills | Status: AC

## 2018-02-11 NOTE — Patient Instructions (Signed)
Today we updated your med list in our EPIC system...    Continue your current medications the same...  We decided to cut the PREDNISONE back to 10mg  alternating w/ 5mg  every other day...  Continue theAdvair twice daily 7 the spiriva once daily...  Continue the MUCINEX (Guaifenesin) 1200mg  twice daily w/ fluids... ' Try the FLUTTER valve device and some postural drainage as we discussed (do this 2-3 times daily to see if it helps produce any sputum)...  Call me at your convenience after labor day w/ an update on how you are doing... . Let's plan a follow up visit in about 56mo, sooner if needed for problems.Marland KitchenMarland Kitchen

## 2018-02-12 ENCOUNTER — Encounter: Payer: Self-pay | Admitting: Pulmonary Disease

## 2018-02-12 NOTE — Telephone Encounter (Signed)
SN please advise. Thanks.  

## 2018-02-16 ENCOUNTER — Encounter: Payer: Self-pay | Admitting: Pulmonary Disease

## 2018-02-16 NOTE — Progress Notes (Signed)
Subjective:    Patient ID: Fred [name], male    DOB: 08/27/1955, 61 y.o.   MRN: [id number]  HPI 62 y/o WM here for a follow up visit... he has multiple medical problems as noted below...  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 ~  SEE PREV EPIC NOTES FOR OLDER DATA >>   ~  May 01, 2102:  [place] CPX> Fred [name] has had a good year, no new complaints or concerns except his insurance co difficulty w/ Nexium'40mg'$ /d & he will check w/ Fred [name] & 30d vs 90d via his InsurCo...    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 CXR is unchanged...    Chol> on diet alone; he had recent business trip & is worried his diet wasn't what it should have been; FLP showed TChol 198, TG 99, HDL 47, LDL 131    GERD> on Nexium40; Protonix wasn't helpful & he is hopeful that his insurance co will cover the needed Nexium...    Colon Polyp & +FamHx colon ca> last colon 08/09/22 w/ divertics & tiny polyp, f/u due 1/15 and he remains asymptomatic...    Prostate cancer> s/p robotic prostatectomy 5/10 & Fred [name] in [place] released him 9/12 & asked [name] to monitor PSA yearly; Labs today showed PSA= 0.00 We reviewed prob list, meds, xrays and labs> see below for updates >> OK 2013 Flu vaccine today...  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  LABS 10/13:  FLP- ok x LDL=131 on diet alone;  Chems- wnl;  CBC- wnl;  TSH=0.79;  PSA=0.00;  Urine- clear...  ~  November 03, 2013:  59moROV & Fred [name] reports a good interval w/o new complaints or concerns; getting regular exercise (retired) and feeling well- he notes some troublesome sinus  drainage and recent "cold" for which we called in a Pred dosepak but he notes min change; we discussed Rx w/ OTC Antihist, Nasal saline mist Q1-2h, Mucinex- 2Bid, Fluticasone 2sp each nostril Qhs, and Rx for Astelin- 2sp each nostril Bid as needed; he still smokes a pipe regularly & again asked to quit! [facility]... We reviewed the following medical problems during today's office visit >>     COPD, Emphysematous bleb, s/p RUL bullectomy 1997 for infected bleb w/ A/F level, pipe smoker> on Advair250, Spiriva; no intercurrent infections, min cough & sput, no hemoptysis, stable DOE w/o change, no edema, etc; he reports cut back on pipe smoking to ~50% of former level & asked to decr further & work on smoking cessation! CXR is stable, unchanged...    Chol> on diet alone & his weight is good; FLP 4/15 showed TChol 174, TG 55, HDL 43, LDL 120; he does not want meds despite not being optimal, reviewed diet...    GERD> on Nexium40; Protonix wasn't helpful; he denies adb pain, dysphagia, n/v, c/d, blood seen...     Colon Polyp & +FamHx colon ca (bro died at 52)> last colon 112-Jan-2024w/ divertics & tiny polyp, f/u due 1/15 and he remains asymptomatic (he will get this  in [facility])...    Prostate cancer> s/p robotic prostatectomy 5/10 & Fred [name] in [place] released him 9/12 & asked [name] to monitor PSA yearly; Labs today showed PSA= 0.00 We reviewed prob list, meds, xrays and labs> see below for updates >> he did not get the 2014 Flu vaccine last fall & reminded to get this every yr at local pharm...  CXR 4/15 showed norm heart size, vol loss on right w/ pleuroparenchymal scarring, hyperinflation on left w/ bullous emphysema, stable- NAD...  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;  He reports breathing is doing OK & he had one infectious exac 2/16 (we called in meds); unfortunately he is still smoking a pipe "I enjoy it" but less tobacco than before he says; in addition he has been using his Spiriva "prn" as he freq forgets this dose-- we had a productive conversation about his bullous emphysema/ prev surg/ etc, I indicated that while I am delighted that he remains relatively asymptomatic at this time, that he is more rapidly approaching a life of deteriorating resp function w/ daily symptoms/ chronic resp failure/ need for oxygen 24/7, etc; he understands this & I have made yet another pitch for complete smoking cessation & taking his meds regularly day-in and day-out...     COPD, Emphysematous bleb, s/p RUL bullectomy 1997 for infected bleb w/ A/F level, pipe smoker> on Advair250Bid, Spiriva daily; no intercurrent infections, min cough & sput, no hemoptysis, stable DOE w/o change, no edema, etc; he reports cut back on pipe smoking but must quit completely as above; CXR is stable, unchanged w/ chr changes on right & bullous dis on left...    Chol> on diet alone & his weight is good; FLP 7/16 showed TChol 154, TG 79, HDL 41, LDL 97 => improved, keep up the good work!    GERD> on Nexium40; Protonix wasn't helpful; he denies abd pain, dysphagia, n/v, c/d, blood seen; he tried off the Nexium- symptoms returned therefore back on the PPI daily...    Colon Polyp & +FamHx colon ca (bro died at 52)> last colon 101-11-2024w/ divertics & tiny polyp, f/u done in [place] in 2015, he reports neg & [place]    Pro[place] cancer> s/p robotic prostatectomy 5/10 [name]; Labs today showed PSA= 0.00, requests Cialis for daily use. We reviewed prob list, meds, xrays and labs> see below for updates >> Given PREVNAR-13 today...  CXR  02/17/15 showed norm heart size, severe COPD w/ bullous changes on left & s/p surg/ scarring on right, NAD/ no change from 2015...  Spirometry 02/17/15 showed FVC=3.25 (64%), FEV1=1.82 (46%), %1sec=56, mid-flows reduced at 21% predicted; c/w mod airflow obstruction & GOLD Stage3 COPD (can't r/o superimposed restriction w/o LVs)...  LABS 02/17/15:  FLP- all parameters at goals on diet;  Chems- wnl;  CBC- wnl;  TSH=1.10;  PSA= 0.00    IMP/PLAN>>  PFT shows functional deterioration from last studies, now GOLD Stage3, CXR unchanged, Labs OK;  He knows the score & must quit all smoking, take meds every day, try  to avoid infections, increase exercise program..   ~  September 25, 2016:  25moROV & CPX>  CNickalausreports doing well & only recalls one resp exac since last visit- records in Epic/ CareEverywhere reviewed>>    He called here 03/21/16 c/o nasal congestion, chest tight, cough w/ clear mucus;  We called in Pred dosepak, Mucinex, nasal saline...    He called back 8/31 c/o persistent cough (now nonproductive) chest tight/ SOB, denied f/c/s;  Asked to go to PCP vs UC for exam & to be checked...    [facility]> c/o cough & cold-like sx for 1wk, T101.5, CXR showed LUL pneumonia, given IM-Rocephin, Levaquin500 x10d, Tussionex...    He was f/u PCP-[facility] on 9/5> c/o persist fever/ cough/ congestion but feeling sl better; Temp 100.8 &he was given Rocephin IM, continue Levaquin...    He was rechecked 9/7> clinically improved, T98.9, given 10 more days of Levaquin500...    Pt returned 10/13> c/o 1wk hx recurrent cough, sputum, congestion & Dx w/ bronchitis=> given another 10d Levaquin500, no f/u CXR done. Currently he notes a mild "cold" w/ min cough, no sput, no hemoptysis, no f/c/s, & stable SOB/DOE esp if going uphill, pushing wheelbarrow, etc... 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 we reviewed the following medical problems during today's office visit >>     COPD, Emphysematous bleb, s/p RUL bullectomy 1997 for infected bleb w/ A/F level, pipe smoker> on Advair250Bid, Spiriva daily; min cough & sput, no hemoptysis, stable DOE w/o change, no edema, etc; he reports cut back on pipe smoking but must quit completely as above; CXR was stable x yrs w/ chr changes on right & bullous dis on left; then f/u CXR 09/25/16 showed new LUL lesion => CT pending.    New LUL peripheral lesion/ CXR abnormality> found on routine CXR 09/25/16 => work-up in progress w/ CT Chest pending...    Chol> on diet alone & his weight is good; FLP 2/18 showed TChol 164, TG 71, HDL 42, LDL 108 => stable & doing satis on diet...    GERD> on Nexium40; Protonix wasn't helpful; he denies abd pain, dysphagia, n/v, c/d, blood seen; he tried off the Nexium- symptoms returned therefore back on the PPI daily...    Colon Polyp & +FamHx colon ca (bro died at 52)> colon 101/19/2024w/ divertics & tiny polyp, f/u done in [place] in 2015, he reports neg & [place]    Prostate cancer> s/p robotic prostatectomy 5/10 & [name] & [place]; Labs today showed PSA= 0.00, requests Cialis for daily use. EXAM shows Afeb, VSS, O2sat=99% on RA;  HEENT- neg, mallampati1;  Chest- decr BS but clear w/o w/r/r;  Heart- RR gr1/6 SEM w/o r/g;  Abd- soft, nontender, neg;  Ext- neg w/o c/c/e;  Neuro- intact w/o focal abn...  CXR 09/25/16>  Norm heart size, new pleuroparenchymal thickening & nodular density in left apex measuring about 2cm, increased interstitial markings bilat, emphysema & bullous changes bilat...  EKG 09/25/16>  NSR, rate62, IVCD, NAD...  LABS 09/25/16>  FLP- ok w/ LDL=108 on diet alone;  Chems- wnl;  CBC- wnl;  TSH=1.27;  PSA=0.00    IMP/PLAN>>  CKarmelohas a new abn on his f/u CXR in the left apex=> needs CT Chest ASAP for further eval &  we will sched;   In the meanwhile we (again) reviewed the need to quit all smoking (he still smokes a pipe) and take meds regularly- Advair250Bid & Spiriva daily;  He needs to incr his exercise in a gradual exercise program as well;  We gave him the 2017 FLU vaccine today & a prescription for a ZPak to keep on hand.  ADDENDUM>>  CT CHEST w/ contrast 10/02/16 showed norm heart size, atherosclerosis Ao & L-Circ, norm caliber PAs & no PE seen; mod centrilob & paraseptal emhysema w/ bullous changes bilat & s/p RUL wedge resection w/ post surg scarring; thick walled irreg 6.5 x 3.7 cm cavitary lesion in LUL apical-post;  2 small nodules 4-2m size in lingula and RML noted; two 1.0-1.2cm nodes (L mediastinal & R hilar); s/pGB & 2cm R-renal cyst... NOTE: we discussed CT results and options=> decided to proceed w/ PET scan...  ADDENDUM>>  PET scan 10/23/16 showed a cavitary left apical process w/ nodularity along its inferior border tracking along the pleural surface (similar appearance to the recent CT Chest)- max assoc SUV is along this inferior pleural nodularity w/ max SUV=5.8;  Underlying emphysema, calcif biapical scarring R>L;  approx 6 x 2 cm periph density LLL posteromedially w/ max SUV=2.2 & likely benign;  Right hilar adenopathy is faintly hypermet w/ max SUV=3.0;  Incidental focal metabolic actiivity along the cecum w/ SUV max=9.7 (tumor cannot be excluded, no adenopathy apprec in this area)=> we will refer to his GI...  I reviewed these scans w/ Radiology & their IR team=> they felt that needle bx LUL could be done but carried a signif risk of pneumothorax & they suggested watchful waiting w/ repeat CT Chest in 3-4 months;  I called Pt & wife to review all this & they are in agreement w/ conservative approach & repeat scan in [id number] planned;  They know to call me for any problems, CJuandavidwill call his gastroenterologist for OV to discuss PET scan & address ?need for repeat colon at this time...   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 He remains on ADVAIR250Bid & SPIRIVA once daily, uses Mucinex prn, and has had ZPak & Cipro given in the interim;  He still smokes the pipe- "I'm cutting back every day" & I implored him to quit completely... we reviewed the following medical problems during today's office visit >>     COPD, Emphysematous blebs, s/p RUL bullectomy 1997 for infected bleb w/ A/F level, pipe smoker> on Advair250Bid, Spiriva daily; min cough & sput, no hemoptysis, stable DOE w/o change, no edema, etc; he reports cut back on pipe smoking but must quit completely as above; CXR was stable x yrs w/ chr changes on right & bullous dis on left; then he developed L[place] pneumonia [place], & f/u CXR here 09/25/16 showed new LUL abnormality => CT  Chest, subseq PET scan, and serial CT scans as documented...    Hx LUL pneumonia [id number] w/ residual LUL parenchymal abnormality> clinically pt is feeling & breathing well, at baseline, w/o much cough/ sputum/ etc; we considered bronch & lavage- following serial scans for now...    Chol> on diet alone & his weight is good; FLP 2/18 showed TChol 164, TG 71, HDL 42, LDL 108 => stable & doing satis on diet...    GERD> on Nexium40; Protonix wasn't helpful; he denies abd pain, dysphagia, n/v, c/d, blood seen; he tried off the Nexium- symptoms returned therefore back on the PPI daily...    Colon Polyp & +FamHx colon ca (bro died  at 52)> colon 08/20/2022 w/ divertics & tiny polyp, f/u done in [place] in 2015, he reports neg & will get copies sent to [name]; PET scan 09/2016 for lung abn showed focal metabolic activ along the cecum (SUV 9.7) & he had f/u w/ DrSingh in [place] w/ repeat colon showing 1 sm polyp, otherw neg...    Prostate cancer> s/p robotic prostatectomy 5/10 & Fred [name] in [place] released him 9/12 & asked [name] to monitor PSA yearly; Labs 2/18 showed PSA= 0.00, requests Cialis for daily use. EXAM shows Afeb, VSS, O2sat=98% on RA;  HEENT- neg, mallampati1;  Chest- decr BS but clear w/o w/r/r;  Heart- RR gr1/6 SEM w/o r/g;  Abd- soft, nontender, neg;  Ext- neg w/o c/c/e;  Neuro- intact w/o focal abn...  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 I presented case to DrNestor who agreed w/ the conservative approach at this point in light of no clear cut signs of worsening parenchymal dis... We will recheck pt in Nov w/ another CT Chest...  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 He is takingAdvair250Bid, Spiriva once daily, & he has a Zpak for prn use if nec...     Problem List as above- reviewed w/ pt>>  EXAM shows Afeb, VSS, O2sat=97% on RA;  HEENT- neg, mallampati1;  Chest- decr BS but clear w/o w/r/r;  Heart- RR gr1/6 SEM w/o r/g;  Abd- soft, nontender, neg;  Ext- neg w/o c/c/e;  Neuro- intact w/o focal abn...  CT Chest 06/09/17 (independently reviewed by me in the PACS system) shows norm heart size w/ Ao & coronary atherosclerotic calcif; no adenopathy,  biapical pleuroparenchymal scarring w/ bullous emphysema, thick-walled bullous lesion in left apex has a new A/F level & findings are otherw unchanged from 01/28/17, a chr infectious process is favored...  IMP/PLAN>>  [place] remains asymtomatic w/o much cough/ sputum/ SOB/ chest discomfort/ etc;  He is not limited in his work/ daily life/ ADLs in any way and he is not inclined to pursue a more aggressive posture for additional evaluation of his pulm process=> we will continue conservative management- he will work on all smoking (pipe) cessation, continue the Advair/ Spiriva, continue exercise, etc... We plan rov in 3-74mow/ CXR & fasting blood work...       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 No CP/ palpit/ edema; NOTE- he is still smoking a pipe despite all efforts to get him to quit...  We reviewed the following medical problems during today's office visit>       COPD, Emphysematous blebs, s/p RUL bullectomy 1997 for infected bleb w/ A/F level> on Advair250Bid, Spiriva daily; min cough & sput, no hemoptysis, stable DOE w/o change, no edema, etc; he reports cut back on pipe smoking but must quit completely as above; CXR was stable x yrs w/ chr changes on right & bullous dis on left; then he [place] L[place], & f/u CXR here 09/25/16 showed new LUL abnormality => CT Chest, subseq PET scan, and serial CT scans as documented...    Hx LUL pneumonia [id number]/ residual LUL parenchymal abnormality & several small scat bilat pulm nodules on CT Chest > clinically pt is feeling & breathing well, at baseline, w/o much cough/ sputum/ etc; we considered bronch & lavage- but pt preferred following serial scans for now...    PIPE SMOKER>  Despite his best efforts- he has been unable to quit the pipe smoking...    Coronary & aortic atherosclerosis on CT Chest>  EKGs show NSR, IVCD, ?early repol, no acute changes...     Chol> on diet alone & his weight is good; FLP 2/18 showed TChol 164, TG 71, HDL 42, LDL 108 => stable & doing satis on diet...    GERD> on Nexium40; Protonix wasn't helpful; he denies abd pain, dysphagia, n/v, c/d, blood seen; he tried off the Nexium- symptoms returned therefore back on the PPI daily...    Colon Polyp & +FamHx colon ca (bro died at 52)> colon 07/2022-02-02/ divertics & tiny polyp, f/u done in [place] in 2015, he reports neg & [place] for lung abn showed focal metabolic activ along the cecum (SUV 9.7) & [place] showing 1 sm polyp, otherw neg...    Prostate cancer> s/p robotic prostatectomy 5/10 [name]; Labs 2/18 showed PSA= 0.00, requests Cialis for daily use. EXAM shows Afeb, VSS, O2sat=97% on RA;  HEENT- neg, mallampati1;  Chest- decr BS but clear w/o w/r/r;  Heart- RR  gr1/6 SEM w/o r/g;  Abd- soft, nontender, neg;  Ext- neg w/o c/c/e;  Neuro- intact w/o focal  abn.Marland KitchenMarland Kitchen  CXR 09/10/17 (independently reviewed by me in the PACS system) showed norm heart size, COPD/ emphysema w/ biapical bullous formation L>R w/ air-fluid level in bleb...  LABS 09/10/17>  FLP- ok on diet alone;  Chems- wnl;  CBC- wnl;  TSH=1.38;  PSA=0.00;  Sed=12  IMP/PLANS>>  We decided to treat Fred [name]' symptoms w/ ZPak/ Pred dosepak/ Hycodan and he will try to decr & quit the pipe smoking!!! LUL changes continue to evolve & I am concerned that he has an air-fluid level that could get infected (but the norm CBC & Sed rate are reassuring); we will continue to monitor carefully & plan f/u CT Chest [id number]...   ~  November 06, 2017:  35moROV & add-on appt requested for a several week hx of incr cough, small amt of yellow sputm production, nasal & chest congestion w/ sneezing, drainage, incr SOB & chest tightness;  He thinks poss low grade fever (didn't take temp but hot/cold w/ chills), denies CP, no hemoptysis; he has remained regular w/ his Advair250Bid & Spiriva daily, he has Hycodan to use as needed; unfortunately he continues to smoke his pipe & did so on the way [place] [facility]..     He has known bullous emphysema w/ hx infected RUL bleb w/ air-fluid level that required surg in 1997; hx normal A1AT level & MM phenotype, neg fam hx emphysema or pulm problems; persistent pipe smoker; he had been stable w/ his baseline GOLD Stage 3 COPD/emphysema (FEV1=1.82, 46%predicted) until the Fall [facility] 2017 [facility] [place] [facility] [place] but w/ signif changes in his CXR & CTChest showing worsening LUL dis w/ pleuro-parenchymal changes and a smaller A/F level seen;  We treated him w/ additional antibiotics and Prednisone & have been following this conservatively w/ CXR/ scans/ etc...     EXAM shows Afeb, VSS, O2sat=98% on RA;  HEENT- neg, mallampati1;  Chest- decr BS  w/ few bibasilar rhonchi, no wheezing/ rales/ or consolidation;  Heart- RR gr1/6 SEM w/o r/g;  Abd- soft, nontender, neg;  Ext- neg w/o c/c/e;  Neuro- intact w/o focal abn...  CXR 11/06/17>  (independently reviewed by me in the PACS system) showed baseline COPD/emphysema w/ LUL bullous lesion w/ ?larger A/F level & incr markings in LUL- ant & central regions, suggest f/u CT for better delineation...   Given NEB treatment in office but unable to expectorate any sput for cultures...  Ambulatory Oximetry 11/06/17>  O2sat=100% on RA at rest w/ pulse=80/min;  Pt ambulated 3 laps in the office (185'each) w/ lowest O2sat=98% w/ pulse=92/min... No desaturations.  LABS 11/06/17>  Chems- wnl w/ BS=92, Cr=0.81, LFTs wnl etc;  CBC- wnl w/ Hg=13.9, WBC=9.4 w/ norm diff;  Sed=44  CT Chest 11/06/17>  Norm heart size, coronary & Ao atherosclerosis, norm caliber pulm arteries; mod centrilob & paraseptal emphysema w/ diffuse bronch wall thickening, postsurg changes from RUL apical bleb resection; thick walled post left apical/LUL bullous lesion w/ A/F level similar to 06/09/17 scan; scat LUL nodules stable from priors and considered benign; new patchy consolidation & GGO in LUL ant & central area w/ tree-in-bud opac- ?progression of chr infections vs new superimposed infectious process; area fo left pleural thickening w/o effusion...  IMP/PLAN>>  CT w/ ?worsening or ?superimposed LUL parenchymal process- no sput to culture, Afeb, Labs are ok w/ norm CBC/Chem panel but elev sed at 44; we decided to treat w/ AUGMENTIN 875Bid x  10d (cover w/ Align & Activia), plus a course of oral PRED- Depo120 today, then Pred'20mg'$  Bid x1wk, Qd x1wk, then '10mg'$  daily til ret in 4wks;  Rec Mucinex, water, Hycodan prn; call for any worsening in breathing;  NO SMOKING and take it easy until recheck appt...  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        He has known bullous emphysema w/ hx infected RUL bleb w/ air-fluid level that required surg in 1997; hx normal A1AT level & MM phenotype, neg fam hx emphysema or pulm problems; persistent pipe smoker; he had been stable w/ his baseline GOLD Stage 3 COPD/emphysema (FEV1=1.82, 46%predicted) until the Fall of 2017 when he developed LUL pneumonia in [facility] treated by his PCP w/ Levaquin but w/ signif changes in his CXR & CTChest showing worsening LUL bullous dis w/ pleuro-parenchymal changes and a small A/F level seen;  We treated him w/ additional antibiotics and Prednisone & have been following this conservatively w/ CXR/ scans/ etc...     EXAM shows Afeb, VSS, O2sat=95% on RA;  HEENT- neg, mallampati1;  Chest- decr BS bilat w/o w/r/r/ or consolidation;  Heart- RR gr1/6 SEM w/o r/g;  Abd- soft, nontender, neg;  Ext- neg w/o c/c/e;  Neuro- intact w/o focal abn...  CXR 12/17/17 (independently reviewed by me in the PACS system) shows norm heart soze, COPD, emphysema, left apical bullous change w/ air fluid level similar to prior- NAD... IMP/PLAN>>  Fred [name] is clinically improved and back to baseline, unfortunately still smoking his pipe but he requests Rx for CHANTIX & says he'll try it!  CXR shows similar chronic thick-walled LUL bullous lesion & A/F level and parenchymal scarring but no evid of pneumonia or air-space dis;  Pred has weaned down to '10mg'$  Qam & we discussed slow further taper to '10mg'$  alt w/ '5mg'$  Qod for the next month, then asked him to call me w/ update, & would anticipate weaning further to '5mg'$ /d after that w/ ROV in about 77mo..    ~  February 11, 2018:  89moOV & Fred [name]  called about 3wks ago w/ increased cough, yellow sput, and incr SOB; he denied f/c/s- we called in another 10d round of Augmentin & bumped his Pred back to '10mg'$  Qam, reminded him to take align & Activia yogurt;  He reports improved on this rx but not quite back to baseline w/ resid cough, decreased sput production but still beige colored, SOB improving toward baseline, no CP/ paplit/ dizzy/ etc... I have asked him to quit the pipe smoking once & for all, plus continue the PRED '10mg'$  tabs- decr to '10mg'$  alt w/ '5mg'$  Qod if able, ADVAIR250Bid, Spiriva daily, Hycodan, Zyrtek  & Astelin as needed... 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    Hx LUL pneumonia [id number] w/ residual LUL parenchymal abnormality & several small scat bilat pulm nodules on CT Chest > clinically pt is feeling & breathing well, at baseline, w/o much cough/ sputum/ etc; we considered bronch & lavage- but pt preferred following serial scans for now...    PIPE SMOKER>  Despite his best efforts- he has been unable to quit the pipe smoking...    Coronary & aortic atherosclerosis on CT Chest>  EKGs show NSR, IVCD, ?early repol, no acute changes...     Chol> on diet alone & his weight is good; FLP 2/19 showed TChol 148, TG 86, HDL 37, LDL 94 => stable & doing satis on diet...    GERD> on Nexium40; Protonix wasn't helpful; he denies abd pain, dysphagia, n/v, c/d, blood seen; he tried off the Nexium- symptoms  returned therefore back on the PPI daily...    Colon Polyp & +FamHx colon ca (bro died at 52)> colon 08/27/2022 w/ divertics & tiny polyp, f/u done in [place] in 2015, he reports neg & will get copies sent to [name]; PET scan 09/2016 for lung abn showed focal metabolic activ along the cecum (SUV 9.7) & he had f/u w/ DrSingh in [place] w/ repeat colon showing 1 sm polyp, otherw neg...    Prostate cancer> s/p robotic prostatectomy 5/10 & Fred [name] in [place] released him 9/12 & asked [name] to monitor PSA yearly; Labs 2/19 showed PSA= 0.00, requests Cialis for daily use. EXAM shows Afeb, VSS, O2sat=97% on RA;  HEENT- neg, mallampati1;  Chest- decr BS bilat w/o w/r/r/ or consolidation;  Heart- RR gr1/6 SEM w/o r/g;  Abd- soft, nontender, neg;  Ext- neg w/o c/c/e;  Neuro- intact w/o focal abn... IMP/PLAN>>  I.m worried about the recurrent infectious exacerbations & the probability of further deteriorating lung function over time; Must quit all smoking & take meds as outlined regularly;  We discussed my retirement at the end of 2019 7 transition to [facility] in [place]...            Problem List:  ALLERGY (ICD-995.3) - uses OTC antihistamines Prn...  COPD (ICD-496) & EMPHYSEMATOUS BLEB (ICD-492.0) - on [place] daily... unfortunately he still smokes a pipe daily (w/ min cough, drainage, etc)... he has a neg FamHx of lung disease and a normal A1AT level (MM phenotype)... he is s/p VATS- RUL bullectomy in 1997 for infected bulla w/ A/F level. ~  baseline CXR's back to 1990 showed large bilat bullae R>L & scarring at left base... ~  f/u CXR's w/ vol loss on right from the surg, bullae on the left, scarring, etc... ~  prev CT Chest revealed mult blebs & bullae in RUL & LUL to the lingular w/ scarring...  ~  1997= 14cm RUL bulla w/ AF level (required VATS Bullectomy- path= emphysema, infected bulla w/ acute/ chr inflamm). ~  CT Chest 10/09 in [place]= similar bullous dis on left w/ emphysema, post-op changes on  right, NAD,etc. ~  PFT 6/00 showed FVC= 4.41 (84%), FEV1= 2.53 (59%), %1sec= 57, mid-flows= 25%pred... ~  PFT 6/06 showed FVC= 4.59 (87%), FEV1= 2.79 (65%), %1sec= 61, mid-flows= 27%pred... ~  CXR 5/11 showed advanced  chr lung dis w/ hyperinflation, scarring, decr vol on right> NAD. ~  PFT 9/12 showed FVC= 4.07 (79%), FEV1= 2.34 (57%), %1sec= 57, mid-flows= 24%pred... c/w GOLD Stage2 COPD- MUST QUIT ALL SMOKING! ~  CXR 9/12 showed no signif change in his bullous lung dis & evid of prev surg on the right... Asked again to quit the pipe. 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 Must quit all smoking! 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 ~  Spirometry 02/17/15 showed FVC=3.25 (64%), FEV1=1.82 (46%), %1sec=56, mid-flows reduced at 21% predicted; c/w mod airflow obstruction & GOLD Stage3 COPD (can't r/o superimposed restriction w/o LVs)... ~  CXR 09/25/16>  Norm heart size, new pleuroparenchymal thickening & nodular density in left apex measuring about 2cm, increased interstitial markings bilat, emphysema & bullous changes bilat => proceed w/ CT Chest.  ABN CXR w/ LUL peripheral lesion found on routine CXR 09/25/16=> eval in progress... ~  He had LUL pneumonia treated by PCP in [facility] during the Fall2017 but this was not followed up by them...   Hx of CHEST PAIN, ATYPICAL (ICD-786.59) - on ASA '81mg'$ /d... hx intermittent chest wall tightness/ spasm/ ?palpit/ numb in left arm- last 2-3 min and resolves spont, not exercise induced... s/p cardiac eval 2/08 by DrCooper- he wanted to do treadmill and holter but pt never did these and states he is doing fine w/o any incr in symptoms... ~  EKG 5/11 showed NSR 74/min, IVCD, NAD (no ch from old  tracings) ~  EKG 9/12 showed NSR 68/min, IVCD, NAD & no change...  HYPERCHOLESTEROLEMIA, MILD (ICD-272.0) - on diet alone... ~  [place] 12/07 showed TChol 182, TG 80, HDL 36, LDL 130... he prefers diet Rx. ~  [place] 12/09 showed TChol 172, TG 81, HDL 36, LDL 120... rec- improve diet efforts, or consider statin. ~  FLP 5/11 showed TChol 169, TG 94, HDL 37, LDL 113... continue diet efforts. ~  FLP 9/12 showed TChol 172, TG 52, HDL 43, LDL 118 ~  FLP 10/13 on diet alone showed TChol 198, TG 99, HDL 47, LDL 131... Offered Crestor'5mg'$ /d, he will decide. ~  FLP 4/15 on diet alone showed TChol 174, TG 55, HDL 43, LDL 120... He prefers diet/ exercise & declined low dose statin rx. ~  FLP 7/16 on diet alone showed TChol 154, TG 79, HDL 41, LDL 97 ~  FLP 2/18 on diet alone showed TChol 164, TG 71, HDL 42, LDL 108  GERD (ICD-530.81) - on NEXIUM '40mg'$ /d... he states he tried off PPI meds but had to restart due to signif heartburn... ~  9/12:  He reports that insurance co required switch off Nexium onto Protonix but the latter isn't working w/ return of severe reflux symptoms & he wants to restart the [place] '40mg'$ /d... ~  10/13:  Doing satis back on the Nexium, but trouble w/ coverage thru his insurance... ~  4/15:  He continues stable on the Nexium40...  COLONIC POLYPS (ICD-211.3) & Family Hx of ADENOCARCINOMA, COLON (ICD-153.9)  - colonoscopy 7/01 by DrPatterson showed diminutive rectal polyp= hyperplastic... f/u planned 39yr but pt cancelled and never rescheduled the procedure... f/u colonoscopy done 1/10 showed divertics & tiny polyp w/o adenomatous change (+FamHx of  colon cancer in brother who died of the disease at age 66)... Repeat due 1/15. ~  4/15:  He knows that he is due for f/u colonoscopy & will get this from a gastroenterologist in [facility] => he tells me this was done, we do not have record.  ADENOCARCINOMA, PROSTATE (ICD-185) >> ~  5/11:  when seen 12/09 his routine PSA was 5.98 & he was referred to  [facility] in [facility] where he lives for eval> +prostate cancer and s/p robotic prostatectomy 5/10 & doing well so far w/ PSA <0.01 on last check 11/10 w/ f/u due soon (Q47mo. ~  He had episode of micturition syncope that occurred one night- awoke w/ full bladder & was standing to urinate, then fell w/ transient syncope, injured tooth & fx rib; recovered uneventfully & he is asked to sit down to empty bladder, stand slowly & be careful> no recurrence. ~  9/12:  [place]; PSA today = zero... ~  10/13:  Clinically stable w/o voiding difficulty, incont, etc; PSA= 0.00 ~  4/15:  He remains clinically stable w/o complaints and f/u PSA remains zero... ~  7/16:  PSA remains 0.00, he is asking about Cialis for daily use => given Rx.  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  ANXIETY (ICD-300.00)  Health Maintenance >> ~  GI:  His brother died age 5969w/ colon cancer; pt had hyperplastic polyp in past; last colonoscopy was 1/10 by DrPatterson- neg, f/u planned 559yr ~  GU:  Hx prostate cancer D[place] 2010 [name] & doing well since then; PSA= zero ~  Immuniz:  He had PNEUMOVAX in 1999, and gets yearly flu shots... Pneumovax f/u vaccination given 12/09... Given TDAP 5/11   Past Surgical History:  Procedure Laterality Date  . CHOLECYSTECTOMY  2004  . OTHER SURGICAL HISTORY     S/P VATS w/ infected bleb removed from RUL 1997  . PROSTATECTOMY  11/2008   [facility]  Outpatient Encounter Medications as of 12/17/2017  Medication Sig  . aspirin 325 MG tablet Take 325 mg by mouth daily.    . Marland Kitchensomeprazole (NEXIUM) 40 MG capsule TAKE 1 CAPSULE BY MOUTH  DAILY AT 12 NOON.  . Marland Kitchenluticasone-Salmeterol (ADVAIR DISKUS) 250-50 MCG/DOSE AEPB USE 1 INHALATION ORALLY  TWICE DAILY  . Multiple Vitamin (MULTIVITAMIN) tablet Take 1 tablet by  mouth daily.    . predniSONE (DELTASONE) 10 MG tablet Taking 1 tab daily  . tadalafil (CIALIS) 5 MG tablet TAKE 1 TABLET BY MOUTH  DAILY  . tiotropium (SPIRIVA HANDIHALER) 18 MCG inhalation capsule INHALE THE CONTENTS OF 1  CAPSULE VIA HANDIHALER  DAILY  . azelastine (ASTELIN) 0.1 % nasal spray Place 2 sprays into both nostrils 2 (two) times daily as needed for rhinitis. Use in each nostril as directed  . HYDROcodone-homatropine (HYCODAN) 5-1.5 MG/5ML syrup Take 5 mLs by mouth every 6 (six) hours as needed for cough. Every 4-6 hours as needed (Patient not taking: Reported on 11/06/2017)  . [DISCONTINUED] amoxicillin-clavulanate (AUGMENTIN) 875-125 MG tablet Take 1 tablet by mouth 2 (two) times daily.    No Known Allergies    Immunization History  Administered Date(s) Administered  . H1N1 07/01/2008  . Influenza Split 04/26/2011, 05/01/2012  . Influenza,inj,Quad PF,6+ Mos 08/17/2015, 08/21/2016, 06/09/2017  . Pneumococcal Conjugate-13 02/17/2015  .  Pneumococcal Polysaccharide-23 07/01/2008  . Td 12/22/2009  . Tdap 04/26/2011    Current Medications, Allergies, Past Medical History, Past Surgical History, Family History, and Social History were reviewed in [employer] record.     Review of Systems       CC is nasal congestion and drainage, +cough w/ yellow sput, and incr SOB  The patient denies fever, chills, sweats, anorexia, fatigue, weakness, malaise, weight loss, sleep disorder, blurring, diplopia, eye irritation, eye discharge, vision loss, eye pain, photophobia, earache, ear discharge, tinnitus, decreased hearing, nasal congestion, nosebleeds, sore throat, hoarseness, chest pain, palpitations, syncope, orthopnea, PND, peripheral edema, dyspnea at rest, excessive sputum, hemoptysis, wheezing, pleurisy, nausea, vomiting, diarrhea, constipation, change in bowel habits, abdominal pain, melena, hematochezia, jaundice, gas/bloating, indigestion/heartburn, dysphagia,  odynophagia, dysuria, hematuria, urinary frequency, urinary hesitancy, nocturia, incontinence, back pain, joint pain, joint swelling, muscle cramps, muscle weakness, stiffness, arthritis, sciatica, restless legs, leg pain at night, leg pain with exertion, rash, itching, dryness, suspicious lesions, paralysis, paresthesias, seizures, tremors, vertigo, transient blindness, frequent falls, frequent headaches, difficulty walking, depression, anxiety, memory loss, confusion, cold intolerance, heat intolerance, polydipsia, polyphagia, polyuria, unusual weight change, abnormal bruising, bleeding, enlarged lymph nodes, urticaria, allergic rash, hay fever, and recurrent infections.     Objective:   Physical Exam    WD, WN, 62 y/o WM in NAD... GENERAL:  Alert & oriented; pleasant & cooperative... HEENT:  [place]/AT, EOM-wnl, PERRLA, Fundi-benign, EACs-clear, TMs-wnl, NOSE-clear, THROAT-clear & wnl. NECK:  Supple w/ fairROM; no JVD; normal carotid impulses w/o bruits; no thyromegaly or nodules palpated; no lymphadenopathy. CHEST:  decr BS bilat, without wheezes, rales, or rhonchi heard... HEART:  Regular Rhythm; without murmurs/ rubs/ or gallops. ABDOMEN:  Soft & nontender; normal bowel sounds; no organomegaly or masses detected. EXT: without deformities or arthritic changes; no varicose veins/ venous insuffic/ or edema. NEURO:  CN's intact; motor testing normal; sensory testing normal; gait normal & balance OK. DERM:  No lesions noted; no rash etc...  RADIOLOGY DATA:  Reviewed in the EPIC EMR & discussed w/ the patient...  LABORATORY DATA:  Reviewed in the EPIC EMR & discussed w/ the patient...   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 Continue [place], Mucinex as needed, etc...  NEW CXR ABNORMALITY 2/18 w/ LUL peripheral  abnormality that occurred in the wake of a LUL pneumonia from Oct2017- treated w/ Levaquin in [place];  He has had CT, LABS, PET => see above & decision made to continue close follow up w/ serial CXR/ scans...  01/28/17>    We reviewed the serial CT findings & our options;  he remains totally asymptomatic- active in home remodeling, climbing, crawling, working pretty hard w/o dyspnea etc;  In fact his CC is sinus drainage;  Options are to continue current course- no pipe, +Advair/ Spiriva, exercise program, avoid exposures and repeat CT Chest similar 42mo  Alternatively we could consider Bronch w/ Lavage for cytology & cultures, or [place]... I presented case to DrNestor who agreed w/ the conservative approach at this point in light of no clear cut signs of worsening parenchymal dis...  06/09/17>   Fred [name] remains asymtomatic w/o much cough/ sputum/ SOB/ chest discomfort/ etc;  He is not limited in his work/ daily life/ ADLs in any way  and he is not inclined to pursue a more aggressive posture for additional evaluation of his pulm process=> we will continue conservative management- he will work on all smoking (pipe) cessation, continue the Advair/ Spiriva, continue exercise, etc... We plan rov in 3-11mow/ CXR & fasting blood work. 09/10/17>   We decided to treat CJuanda [name] symptoms w/ ZPak/ Pred dosepak/ Hycodan and he will try to decr & quit the pipe smoking!!! LUL changes continue to evolve & I am concerned that he has an air-fluid level that could get infected- (but the norm CBC & Sed rate are reassuring); we will continue to monitor carefully & plan f/u CT Chest [id number]..  11/06/17>   CT w/ ?worsening or ?superimposed LUL parenchymal process- no sput to culture, Afeb, Labs are ok w/ norm CBC/Chem panel but elev sed at 44; we decided to treat w/ AUGMENTIN 875Bid x 10d (cover w/ Align & Activia), plus a course of oral PRED- Depo120 today, then Pred'20mg'$  Bid x1wk, Qd  x1wk, then '10mg'$  daily til ret in 4wks;  Rec Mucinex, water, Hycodan prn; call for any worsening in breathing;  NO SMOKING and take it easy until recheck appt 12/17/17>   Fred Jeansis clinically improved and back to baseline, unfortunately still smoking his pipe but he requests Rx for CHANTIX & says he'll try it!  CXR shows similar chronic thick-walled LUL bullous lesion & A/F level and parenchymal scarring but no evid of pneumonia or air-space dis;  Pred has weaned down to '10mg'$  Qam & we discussed slow further taper to '10mg'$  alt w/ '5mg'$  Qod for the next month, then asked him to call me w/ update, & would anticipate weaning further to '5mg'$ /d after that w/ ROV in about 241mo/17/19>    I.m worried about the recurrent infectious exacerbations & the probability of further deteriorating lung function over time; Must quit all smoking & take meds as outlined regularly;  We discussed [place] at [place] end of 2019 [facility] [place]  PIPE SMOKER>  ChMekainderstands that he must quit all smoking but doesn't seem able to quit the pipe!  Hx AtypCP>  No further chest discomfort; EKG w/ NSR, IVCD, NAD...  Borderline CHOL>  LDL not at goal but he is not inclined to take statin Rx; therefore diet alone...  GERD>  He has signif symptoms and NOT improved w/ Protonix; NEXIUM is the only PPI that works for him & we will re-write this med...  Colon Polyps>  As noted brother died age 61/ colon cancer; last colonoscopy 1/10 was ok & f[facility]u due now- [facility]  PROSTATE CANCER> [name] & asked that we do yearly PSA; todays lab w/ PSA= zero... NOTE: hx one episode of micturition syncope w/o recurrence...  DJD>  Stable, uses OTC analgesics as needed...  Anxiety>  Aware, stable, no meds required...   Patient's Medications  New Prescriptions   RESPIRATORY THERAPY SUPPLIES (FLUTTER) DEVI    As directed  Previous Medications   ASPIRIN 325 MG TABLET    Take 325  mg by mouth daily.     AZELASTINE (ASTELIN) 0.1 % NASAL SPRAY    Place 2 sprays into both nostrils 2 (two) times daily as needed for rhinitis. Use in each nostril as directed   BUPROPION (WELLBUTRIN SR) 150 MG 12 HR TABLET    1 tab daily X1 week, then maintenance 1 tab twice daily.   CETIRIZINE (ZYRTEC)  10 MG TABLET    Take 10 mg by mouth at bedtime.   ESOMEPRAZOLE (NEXIUM) 40 MG CAPSULE    TAKE 1 CAPSULE BY MOUTH  DAILY AT 12 NOON.   FLUTICASONE-SALMETEROL (ADVAIR DISKUS) 250-50 MCG/DOSE AEPB    USE 1 INHALATION ORALLY  TWICE DAILY   HYDROCODONE-HOMATROPINE (HYCODAN) 5-1.5 MG/5ML SYRUP    Take 5 mLs by mouth every 6 (six) hours as needed for cough. Every 4-6 hours as needed   MULTIPLE VITAMIN (MULTIVITAMIN) TABLET    Take 1 tablet by mouth daily.     PREDNISONE (DELTASONE) 10 MG TABLET    Alternate 1 tab one day and 1/2 tab the next   TADALAFIL (CIALIS) 5 MG TABLET    TAKE 1 TABLET BY MOUTH  DAILY   TIOTROPIUM (SPIRIVA HANDIHALER) 18 MCG INHALATION CAPSULE    INHALE THE CONTENTS OF 1  CAPSULE VIA HANDIHALER  DAILY  Modified Medications   No medications on file  Discontinued Medications   AMOXICILLIN-CLAVULANATE (AUGMENTIN) 875-125 MG TABLET    Take 1 tablet by mouth 2 (two) times daily.

## 2018-02-18 ENCOUNTER — Ambulatory Visit: Payer: 59 | Admitting: Pulmonary Disease

## 2018-03-04 ENCOUNTER — Telehealth: Payer: Self-pay | Admitting: Pulmonary Disease

## 2018-03-04 MED ORDER — DOXYCYCLINE HYCLATE 100 MG PO TABS: 100.0000 mg | ORAL_TABLET | Freq: Two times a day (BID) | ORAL | 0 refills | Status: DC

## 2018-03-04 NOTE — Telephone Encounter (Signed)
Per SN- Doxycycline 100mg  #28, take 1 PO BID for 2 weeks.  Stay out of direct sunlight, eat activia, or yogurt.  Continue Advair BID,Spiriva QD, Prednisone 10-5, flutter, and postural drainage. Spoke with Patient.  Instructions given.  Patient is to call back in 2 weeks after Doxycycline completed to give SN update.  Patient stated understanding.  Prescription sent to preferred pharmacy [place], [place].  Nothing further needed at this time.

## 2018-03-04 NOTE — Telephone Encounter (Signed)
Patient is calling back with increase Cough and mucus production yellow in color. No fever , Chills , Nausea or vomiting. He would like to know if he should be put on antibiotic or seen in office.  Allergies as of 03/04/2018   No Known Allergies     Medication List        Accurate as of 03/04/18  3:29 PM. Always use your most recent med list.          aspirin 325 MG tablet Take 325 mg by mouth daily.   azelastine 0.1 % nasal spray Commonly known as:  ASTELIN Place 2 sprays into both nostrils 2 (two) times daily as needed for rhinitis. Use in each nostril as directed   buPROPion 150 MG 12 hr tablet Commonly known as:  WELLBUTRIN SR 1 tab daily X1 week, then maintenance 1 tab twice daily.   cetirizine 10 MG tablet Commonly known as:  ZYRTEC Take 10 mg by mouth at bedtime.   esomeprazole 40 MG capsule Commonly known as:  NEXIUM TAKE 1 CAPSULE BY MOUTH  DAILY AT 12 NOON.   Fluticasone-Salmeterol 250-50 MCG/DOSE Aepb Commonly known as:  ADVAIR DISKUS USE 1 INHALATION ORALLY  TWICE DAILY   FLUTTER Devi As directed   HYDROcodone-homatropine 5-1.5 MG/5ML syrup Commonly known as:  HYCODAN Take 5 mLs by mouth every 6 (six) hours as needed for cough. Every 4-6 hours as needed   multivitamin tablet Take 1 tablet by mouth daily.   predniSONE 10 MG tablet Commonly known as:  DELTASONE Alternate 1 tab one day and 1/2 tab the next   tadalafil 5 MG tablet Commonly known as:  CIALIS TAKE 1 TABLET BY MOUTH  DAILY   tiotropium 18 MCG inhalation capsule Commonly known as:  SPIRIVA HANDIHALER INHALE THE CONTENTS OF 1  CAPSULE VIA HANDIHALER  DAILY

## 2018-03-17 ENCOUNTER — Encounter: Payer: Self-pay | Admitting: Pulmonary Disease

## 2018-03-17 NOTE — Telephone Encounter (Signed)
-----   Message -----  From: Lezlie Lye  Sent: 03/17/2018 10:21 AM EDT  To: Teressa Lower, MD Subject: Non-Urgent Medical Question  Fred [name] had requested that I let you know how I was doing after the 10 days of antibiotics. Things really haven't changed much. I don't believe this antibiotic was as effective as the others I have taken. Still coughing and at times getting a yellow phlegm. I will finish the antibiotics this evening and I am still on steroid and mucinex. Not sure if I need to do anything further or just wait it out. Please let me know if Dr Lenna Gilford thinks I need to come in or take additional meds. Thanks, [employer]  Received the message above from patient. Per his chart, he was given Doxycycline 100mg  for 14 days on 03/04/18.    Dr. Lenna Gilford, please advise. Thanks!

## 2018-05-13 ENCOUNTER — Ambulatory Visit (INDEPENDENT_AMBULATORY_CARE_PROVIDER_SITE_OTHER): Payer: 59

## 2018-05-13 ENCOUNTER — Ambulatory Visit (INDEPENDENT_AMBULATORY_CARE_PROVIDER_SITE_OTHER)
Admission: RE | Admit: 2018-05-13 | Discharge: 2018-05-13 | Disposition: A | Discharge status: Home / Self Care | Payer: 59 | Source: Ambulatory Visit | Attending: Pulmonary Disease | Admitting: Pulmonary Disease

## 2018-05-13 ENCOUNTER — Ambulatory Visit (INDEPENDENT_AMBULATORY_CARE_PROVIDER_SITE_OTHER): Payer: 59 | Admitting: Pulmonary Disease

## 2018-05-13 ENCOUNTER — Encounter: Payer: Self-pay | Admitting: Pulmonary Disease

## 2018-05-13 VITALS — BP 126/76 | HR 86 | Temp 97.7°F | Ht 72.0 in | Wt 185.4 lb

## 2018-05-13 DIAGNOSIS — Z8701 Personal history of pneumonia (recurrent): Secondary | ICD-10-CM | POA: Diagnosis not present

## 2018-05-13 DIAGNOSIS — J439 Emphysema, unspecified: Secondary | ICD-10-CM | POA: Diagnosis not present

## 2018-05-13 DIAGNOSIS — R9389 Abnormal findings on diagnostic imaging of other specified body structures: Secondary | ICD-10-CM | POA: Diagnosis not present

## 2018-05-13 DIAGNOSIS — Z23 Encounter for immunization: Secondary | ICD-10-CM

## 2018-05-13 DIAGNOSIS — J432 Centrilobular emphysema: Secondary | ICD-10-CM

## 2018-05-13 DIAGNOSIS — F17290 Nicotine dependence, other tobacco product, uncomplicated: Secondary | ICD-10-CM

## 2018-05-13 NOTE — Progress Notes (Addendum)
Subjective:    Patient ID: Fred [name], male    DOB: 1955/08/21, 62 y.o.   MRN: [id number]  HPI 62 y/o WM here for a follow up visit... he has multiple medical problems as noted below...  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 ~  SEE PREV EPIC NOTES FOR OLDER DATA >>   ~  Oct2013:  COPD/ Emphysema, s/p RUL bullectomy 1997 for infected bleb w/ A/F level, pipe smoker> on Advair250, Spiriva; no intercurrent infections, min cough, no sput, no hemoptysis, stable DOE w/o change, no edema, etc; he reports cut back on pipe smoking to ~50% of former level & asked to decr further & work on smoking cessation!   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  LABS 10/13:  FLP- ok x LDL=131 on diet alone;  Chems- wnl;  CBC- wnl;  TSH=0.79;  PSA=0.00;  Urine- clear...  CXR 4/15 showed norm heart size, vol loss on right w/ pleuroparenchymal scarring, hyperinflation on left w/ bullous emphysema, stable- NAD...  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  CXR 02/17/15 showed norm heart size, severe COPD w/ bullous changes on left & s/p surg/ scarring on right, NAD/ no change from 2015...  Spirometry 02/17/15 showed FVC=3.25 (64%), FEV1=1.82 (46%), %1sec=56, mid-flows reduced at 21% predicted; c/w mod airflow obstruction & GOLD Stage3 COPD (can't r/o superimposed restriction w/o LVs)...  LABS 02/17/15:  FLP- all parameters at goals on diet;  Chems- wnl;  CBC- wnl;  TSH=1.10;  PSA= 0.00      ~  September 25, 2016:  29moROV & CPX>  CLeonidusreports doing well & only recalls one resp exac since last visit- records in Epic/ CareEverywhere reviewed>>    He called here 03/21/16 c/o nasal congestion, chest tight, cough w/ clear mucus;  We called in Pred dosepak, Mucinex, nasal saline...    He called back 8/31 c/o persistent cough (now nonproductive) chest tight/ SOB, denied f/c/s;  Asked to go to PCP vs UC for exam & to be checked...    [name]> c/o cough & cold-like sx for 1wk, T101.5, CXR showed LUL pneumonia, given IM-Rocephin, Levaquin500 x10d, Tussionex...    He was f/u PCP-Fred [name] on 9/5> c/o persist fever/ cough/ congestion but feeling sl better; Temp 100.8 &he was given Rocephin IM, continue Levaquin...    He was rechecked 9/7> clinically improved, T98.9, given 10 more days of Levaquin500...    Pt returned 10/13> c/o 1wk hx recurrent cough, sputum, congestion & Dx w/ bronchitis=> given another 10d Levaquin500, no f/u CXR done. 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  We reviewed the following medical problems during today's office visit>  COPD, Emphysematous bleb, s/p RUL bullectomy 1997 for infected bleb w/ A/F level, pipe smoker> on Advair250Bid, Spiriva daily; min cough & sput, no hemoptysis, stable DOE w/o change, no edema, etc; he reports cut back on pipe smoking but must quit completely as above; CXR was stable x yrs w/ chr changes on right & bullous dis on left; then f/u CXR 09/25/16 showed new LUL lesion => CT pending.    New LUL peripheral lesion/ CXR abnormality> found on routine CXR 09/25/16 => work-up in progress w/ CT Chest pending...    Chol> on diet alone & his weight is good; FLP 2/18 showed  TChol 164, TG 71, HDL 42, LDL 108 => stable & doing satis on diet...    GERD> on Nexium40; Protonix wasn't helpful; he denies abd pain, dysphagia, n/v, c/d, blood seen; he tried off the Nexium- symptoms returned therefore back on the PPI daily...    Colon Polyp & +FamHx colon ca (bro died at 52)> colon Aug 18, 2022 w/ divertics & tiny polyp, f/u done in [place] in 2015, he reports neg & will get copies sent to [name]     Prostate cancer> s/p robotic prostatectomy 5/10 & Fred [name] in [place] released him 9/12 & asked [name] to monitor PSA yearly; Labs today showed PSA= 0.00, requests Cialis for daily use. EXAM shows Afeb, VSS, O2sat=99% on RA;  HEENT- neg, mallampati1;  Chest- decr BS but clear w/o w/r/r;  Heart- RR gr1/6 SEM w/o r/g;  Abd- soft, nontender, neg;  Ext- neg w/o c/c/e;  Neuro- intact w/o focal abn...  CXR 09/25/16>  Norm heart size, new pleuroparenchymal thickening & nodular density in left apex measuring about 2cm, increased interstitial markings bilat, emphysema & bullous changes bilat...  EKG 09/25/16>  NSR, rate62, IVCD, NAD...  LABS 09/25/16>  FLP- ok w/ LDL=108 on diet alone;  Chems- wnl;  CBC- wnl;  TSH=1.27;  PSA=0.00    IMP/PLAN>>  Fred [name] has a new abn on his f/u CXR in the left apex=> needs CT Chest ASAP for further eval & we will sched;  In the meanwhile we (again) reviewed the need to quit all smoking (he still smokes a pipe) and take meds regularly- Advair250Bid & Spiriva daily;  He needs to incr his exercise in a gradual exercise program as well;  We gave him the 2017 FLU vaccine today & a prescription for a ZPak to keep on hand.  ADDENDUM>>  CT CHEST w/ contrast 10/02/16 showed norm heart size, atherosclerosis Ao & L-Circ, norm caliber PAs & no PE seen; mod centrilob & paraseptal emhysema w/ bullous changes bilat & s/p RUL wedge resection w/ post surg scarring; thick walled irreg 6.5 x 3.7 cm cavitary lesion in LUL apical-post;  2 small nodules 4-5m size in lingula and RML noted; two 1.0-1.2cm  nodes (L mediastinal & R hilar); s/pGB & 2cm R-renal cyst... NOTE: we discussed CT results and options=> decided to proceed w/ PET scan...  ADDENDUM>>  PET scan 10/23/16 showed a cavitary left apical process w/ nodularity along its inferior border tracking along the pleural surface (similar appearance to the recent CT Chest)- max assoc SUV is along this inferior pleural nodularity w/ max SUV=5.8;  Underlying emphysema, calcif biapical scarring R>L;  approx 6 x 2 cm periph density LLL posteromedially w/ max SUV=2.2 & likely benign;  Right hilar adenopathy is faintly hypermet w/ max SUV=3.0;  Incidental focal metabolic actiivity along the cecum w/ SUV max=9.7 (tumor cannot be excluded, no adenopathy apprec in this area)=> we  will refer to his GI...  I reviewed these scans w/ Radiology & their IR team=> they felt that needle bx LUL could be done but carried a signif risk of pneumothorax & they suggested watchful waiting w/ repeat CT Chest in 3-4 months;  I called Pt & wife to review all this & they are in agreement w/ conservative approach & repeat scan in [id number] is planned;  They know to call me for any problems, Nathanuel will call his gastroenterologist for OV to discuss PET scan & address ?need for repeat colon at this time...   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 He remains on ADVAIR250Bid & SPIRIVA once daily, uses Mucinex prn, and has had ZPak & Cipro given in the  interim;  He still smokes the pipe- "I'm cutting back every day" & I implored him to quit completely... We reviewed the following medical problems during today's office visit>      COPD, Emphysematous blebs, s/p RUL bullectomy 1997 for infected bleb w/ A/F level, pipe smoker> on Advair250Bid, Spiriva daily; min cough & sput, no hemoptysis, stable DOE w/o change, no edema, etc; he reports cut back on pipe smoking but must quit completely as above; CXR was stable x yrs w/ chr changes on right & bullous dis on left; then [place] & f/u CXR here 09/25/16 showed new LUL abnormality => CT Chest, subseq PET scan, and serial CT scans as documented...    Hx LUL pneumonia [id number]/ residual LUL parenchymal abnormality> clinically pt is feeling & breathing well, at baseline, w/o much cough/ sputum/ etc; we considered bronch & lavage- following serial scans for now...    Chol> on diet alone & his weight is good; FLP 2/18 showed TChol 164, TG 71, HDL 42, LDL 108 => stable & doing satis on diet...    GERD> on Nexium40; Protonix wasn't helpful; he denies abd pain, dysphagia, n/v, c/d, blood seen; he tried off the Nexium- symptoms returned therefore back on the PPI daily...    Colon Polyp & +FamHx colon ca (bro died at 52)> colon 102/07/2024w/ divertics & tiny polyp, f/u done in [place] in 2015, he reports neg & [place] 09/2016 for lung abn showed focal metabolic activ along the cecum (SUV 9.7) & [place] showing 1 sm polyp, otherw neg...    Prostate cancer> s/p [place] 5[place]10 [name]; Labs 2/18 showed PSA= 0.00, requests Cialis for daily use. EXAM shows Afeb, VSS, O2sat=98% on RA;  HEENT- neg, mallampati1;  Chest- decr BS but clear w/o w/r/r;  Heart- RR gr1/6 SEM w/o r/g;  Abd- soft, nontender, neg;  Ext- neg w/o c/c/e;  Neuro-  intact w/o  focal abn...  CT Chest 01/28/17 (independently reviewed by me in the PACS system)>  norm heart size w/ Ao & coronary atherosclerosis; no adenopathy, left sided pleural thickening (esp medialy & inferiorly); advanced bullous emphysema w/ centrilob & paraseptal components; right apical pleuro-parenchymal scarring w/ nodular components; cavitary left apical process w/ nodular component inferiorly & cavitary wall thickening (overall similar to prev- they rec f/u in 3-60mo IMP/PLAN>>  We reviewed the serial CT findings & our options;  he remains totally asymptomatic- active in home remodeling, climbing, crawling, working pretty hard w/o dyspnea etc;  In fact his CC is sinus drainage;  Options are to continue current course- no pipe, +Advair/ Spiriva, exercise program, avoid exposures and repeat CT Chest similar 417mo Alternatively we could consider Bronch w/ Lavage for cytology & cultures, or [facility]... I presented case to DrNestor who agreed w/ the conservative approach at this point in light of no clear cut signs of worsening parenchymal dis... We will recheck pt in Nov w/ another CT Chest...  ~  June 09, 2017:  49m31moV & Erubiel had his f/u CT Chest today>  He continues to feel well, notes min cough, rarely prod of sm amt clear sput- no color, no blood, and he denies SOB despite continuing his building work/ ladders/ etc;  His CC remains his sinues w/ drainage and he is again rec to quit the pipe smoking 7 use Zyrtek10, Flonase, etc... He is takingAdvair250Bid, Spiriva once daily, & he has a Zpak for prn use if nec...     Problem List as above- reviewed w/ pt>>  EXAM shows Afeb, VSS, O2sat=97% on RA;  HEENT- neg, mallampati1;  Chest- decr BS but clear w/o w/r/r;  Heart- RR gr1/6 SEM w/o r/g;  Abd- soft, nontender, neg;  Ext- neg w/o c/c/e;  Neuro- intact w/o focal abn...  CT Chest 06/09/17 (independently reviewed by me in the PACS system) shows  norm heart size w/ Ao & coronary atherosclerotic calcif; no adenopathy, biapical pleuroparenchymal scarring w/ bullous emphysema, thick-walled bullous lesion in left apex has a new A/F level & findings are otherw unchanged from 01/28/17, a chr infectious process is favored...  IMP/PLAN>>  ChaTanmains asymtomatic w/o much cough/ sputum/ SOB/ chest discomfort/ etc;  He is not limited in his work/ daily life/ ADLs in any way and he is not inclined to pursue a more aggressive posture for additional evaluation of his pulm process=> we will continue conservative management- he will work on all smoking (pipe) cessation, continue the Advair/ Spiriva, continue exercise, etc... We plan rov in 3-49mo749moCXR & fasting blood work...       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 No CP/ palpit/ edema; NOTE- he is still smoking a pipe despite all efforts to get him to quit...  We reviewed the following medical problems during today's office visit>      COPD, Emphysematous blebs, s/p RUL bullectomy 1997 for infected bleb w/ A/F level> on Advair250Bid, Spiriva daily; min cough & sput, no hemoptysis, stable DOE w/o change, no edema, etc; he reports cut back on pipe  smoking but must quit completely as above; CXR was stable x yrs w/ chr changes on right & bullous dis on left; then he developed LUL pneumonia Fall2017 treated in [place] w/ Levaquin, & f/u CXR here 09/25/16 showed new LUL abnormality => CT Chest, subseq PET scan, and serial CT scans as documented...    Hx LUL pneumonia [id number] w/ residual LUL parenchymal abnormality> clinically pt is feeling & breathing well, at baseline, w/o much cough/ sputum/ etc; we  considered bronch & lavage- following serial scans for now...    PIPE SMOKER> despite all efforts- he is unable to cut back or quit the pipe smoking (~1 packet of tobacco/ wk).    Chol> on diet alone & his weight is good; FLP 2/18 showed TChol 164, TG 71, HDL 42, LDL 108 => stable & doing satis on diet...    GERD> on Nexium40; Protonix wasn't helpful; he denies abd pain, dysphagia, n/v, c/d, blood seen; he tried off the Nexium- symptoms returned therefore back on the PPI daily...    Colon Polyp & +FamHx colon ca (bro died at 52)> colon 2022-08-09 w/ divertics & tiny polyp, f/u done in [place] in 2015, he reports neg & will get copies sent to [name]; PET scan 09/2016 for lung abn showed focal metabolic activ along the cecum (SUV 9.7) & he had f/u w/ DrSingh in [place] w/ repeat colon showing 1 sm polyp, otherw neg...    Prostate cancer> s/p robotic prostatectomy 5/10 & Fred [name] in [place] released him 9/12 & asked [name] to monitor PSA yearly; Labs 2/18 showed PSA= 0.00, requests Cialis for daily use. EXAM shows Afeb, VSS, O2sat=97% on RA;  HEENT- neg, mallampati1;  Chest- decr BS but clear w/o w/r/r;  Heart- RR gr1/6 SEM w/o r/g;  Abd- soft, nontender, neg;  Ext- neg w/o c/c/e;  Neuro- intact w/o focal abn...  CXR 09/10/17 (independently reviewed by me in the PACS system) showed norm heart size, COPD/ emphysema w/ biapical bullous formation L>R w/ air-fluid level in bleb...  LABS 09/10/17>  FLP- ok on diet alone;  Chems- wnl;  CBC- wnl;  TSH=1.38;  PSA=0.00;  Sed=12      IMP/PLANS>>  We decided to treat Juanda Crumble' symptoms w/ ZPak/ Pred dosepak/ Hycodan and he will try to decr & quit the pipe smoking!!! LUL changes continue to evolve & I am concerned that he has an air-fluid level that could get infected (but the norm CBC & Sed rate are reassuring); we will continue to monitor carefully & plan f/u CT Chest [id number]...   ~  November 06, 2017:  4moROV & add-on appt requested for a several week hx of incr cough, small amt  of yellow sputm production, nasal & chest congestion w/ sneezing, drainage, incr SOB & chest tightness;  He thinks poss low grade fever (didn't take temp but hot/cold w/ chills), denies CP, no hemoptysis; he has remained regular w/ his Advair250Bid & Spiriva daily, he has Hycodan to use as needed; unfortunately he continues to smoke his pipe & did so on the way [place] [facility]     He has known bullous emphysema w/ hx infected RUL bleb w/ air-fluid level that required surg in 1997; hx normal A1AT level & MM phenotype, neg fam hx emphysema or pulm problems; persistent pipe smoker; he had been stable w/ his baseline GOLD Stage 3 COPD/emphysema (FEV1=1.82, 46%predicted) until the Fall of 2017 [place] [place] but w/ signif changes in his CXR & CTChest showing worsening LUL dis w/ pleuro-parenchymal changes and a smaller A/F level seen;  We treated him w/ additional antibiotics and Prednisone & have been following this conservatively w/ CXR/ scans/ etc...     EXAM shows Afeb, VSS, O2sat=98% on RA;  HEENT- neg, mallampati1;  Chest- decr BS w/ few bibasilar rhonchi, no wheezing/ rales/ or consolidation;  Heart- RR gr1/6 SEM w/o r/g;  Abd- soft, nontender, neg;  Ext- neg w/o c/c/e;  Neuro- intact w/o focal abn...  CXR 11/06/17>  (independently reviewed by me in the PACS system) showed baseline COPD/emphysema w/ LUL bullous lesion w/ ?larger A/F level & incr markings in LUL- ant & central regions, suggest f/u CT for better delineation...   Given NEB treatment in office but unable to expectorate any sput for cultures...  Ambulatory Oximetry 11/06/17>  O2sat=100% on RA at rest w/ pulse=80/min;  Pt ambulated 3 laps in the office (185'each) w/ lowest O2sat=98% w/ pulse=92/min... No desaturations.  LABS 11/06/17>  Chems- wnl w/ BS=92, Cr=0.81, LFTs wnl etc;  CBC- wnl w/ Hg=13.9, WBC=9.4 w/ norm diff;  Sed=44  CT Chest 11/06/17>  Norm heart size,  coronary & Ao atherosclerosis, norm caliber pulm arteries; mod centrilob & paraseptal emphysema w/ diffuse bronch wall thickening, postsurg changes from RUL apical bleb resection; thick walled post left apical/LUL bullous lesion w/ A/F level similar to 06/09/17 scan; scat LUL nodules stable from priors and considered benign; new patchy consolidation & GGO in LUL ant & central area w/ tree-in-bud opac- ?progression of chr infections vs new superimposed infectious process; area fo left pleural thickening w/o effusion...  IMP/PLAN>>  CT w/ ?worsening or ?superimposed LUL parenchymal process- no sput to culture, Afeb, Labs are ok w/ norm CBC/Chem panel but elev sed at 44; we decided to treat w/ AUGMENTIN 875Bid x 10d (cover w/ Align & Activia), plus a course of oral PRED- Depo120 today, then Pred'20mg'$  Bid x1wk, Qd x1wk, then '10mg'$  daily til ret in 4wks;  Rec Mucinex, water, Hycodan prn; call for any worsening in breathing;  NO SMOKING and take it easy until recheck appt...  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        He has known bullous emphysema w/ hx infected RUL bleb w/ air-fluid level that required surg in 1997; hx normal A1AT level & MM phenotype, neg fam hx emphysema or pulm problems; persistent pipe smoker; he had been stable w/ his baseline GOLD Stage 3 COPD/emphysema (FEV1=1.82, 46%predicted) until the Fall of 2017 when he developed LUL pneumonia in [facility] treated by his PCP w/ Levaquin but w/ signif changes in his CXR & CTChest showing worsening LUL bullous dis w/ pleuro-parenchymal  changes and a small A/F level seen;  We treated him w/ additional antibiotics and Prednisone & have been following this conservatively w/ CXR/ scans/ etc...     EXAM shows Afeb, VSS, O2sat=95% on RA;  HEENT- neg, mallampati1;  Chest- decr BS bilat w/o w/r/r/ or consolidation;  Heart-  RR gr1/6 SEM w/o r/g;  Abd- soft, nontender, neg;  Ext- neg w/o c/c/e;  Neuro- intact w/o focal abn...  CXR 12/17/17 (independently reviewed by me in the PACS system) shows norm heart soze, COPD, emphysema, left apical bullous change w/ air fluid level similar to prior- NAD... IMP/PLAN>>  Harrie Jeans is clinically improved and back to baseline, unfortunately still smoking his pipe but he requests Rx for CHANTIX & says he'll try it!  CXR shows similar chronic thick-walled LUL bullous lesion & A/F level and parenchymal scarring but no evid of pneumonia or air-space dis;  Pred has weaned down to '10mg'$  Qam & we discussed slow further taper to '10mg'$  alt w/ '5mg'$  Qod for the next month, then asked him to call me w/ update, & would anticipate weaning further to '5mg'$ /d after that w/ ROV in about 49mo..   ~  February 11, 2018:  241moOV & Vinh called about 3wks ago w/ increased cough, yellow sput, and incr SOB; he denied f/c/s- we called in another 10d round of Augmentin & bumped his Pred back to '10mg'$  Qam, reminded him to take align & Activia yogurt;  He reports improved on this rx but not quite back to baseline w/ resid cough, decreased sput production but still beige colored, SOB improving toward baseline, no CP/ paplit/ dizzy/ etc... I have asked him to quit the pipe smoking once & for all, plus continue the PRED '10mg'$  tabs- decr to '10mg'$  alt w/ '5mg'$  Qod if able, ADVAIR250Bid, Spiriva daily, Hycodan, Zyrtek  & Astelin as needed... We reviewed the following medical problems during today's office visit>      COPD, Emphysematous blebs, s/p RUL bullectomy 1997 for infected bleb w/ large A/F level> on Advair250Bid, Spiriva daily, Mucinex '1200mg'$ Bid w/  fluids, Flutter valve rx;  hx normal A1AT level & MM phenotype;  min cough & sput, no hemoptysis, stable DOE w/o change, no edema, etc; he reports cut back on pipe smoking but must quit completely as above; CXR was stable x yrs w/ chr changes on right & bullous dis on left; then he developed L[place] pneumonia [place], & f/u CXR here 09/25/16 showed new LUL abnormality => CT Chest, subseq PET scan, and serial CT scans as documented above...    Hx LUL pneumonia [id number]/ residual LUL parenchymal abnormality, small air/fluid level & several small scat bilat pulm nodules on CT Chest > clinically pt is feeling & breathing well, at baseline, w/o much cough/ sputum/ etc; we considered bronch & lavage- but pt preferred following serial scans for now...    PIPE SMOKER>  Despite his best efforts- he has been unable to quit the pipe smoking...    Coronary & aortic atherosclerosis on CT Chest>  EKGs show NSR, IVCD, ?early repol, no acute changes...     Chol> on diet alone & his weight is good; FLP 2/19 showed TChol 148, TG 86, HDL 37, LDL 94 => stable & doing satis on diet...    GERD> on Nexium40; Protonix wasn't helpful; he denies abd pain, dysphagia, n/v, c/d, blood seen; he tried off the Nexium- symptoms returned therefore back on the PPI daily...    Colon Polyp & +FamHx colon ca (bro died at 52)> colon 1/Aug 13, 2022/ divertics & tiny polyp, f/u done in [place] in [place], he reports neg & [place] showed focal metabolic activ along the cecum (SUV 9.7) & he had f/u w/  DrSingh in [place] w/ repeat colon showing 1 sm polyp, otherw neg...    Prostate cancer> s/p robotic prostatectomy 5/10 & Fred [name] in [place] released him 9/12 & asked [name] to monitor PSA yearly; Labs 2/19 showed PSA= 0.00, requests Cialis for daily use. EXAM shows Afeb, VSS, O2sat=97% on RA;  HEENT- neg, mallampati1;  Chest- decr BS bilat w/o w/r/r/ or consolidation;  Heart- RR gr1/6 SEM  w/o r/g;  Abd- soft, nontender, neg;  Ext- neg w/o c/c/e;  Neuro- intact w/o focal abn... IMP/PLAN>>  I.m worried about the recurrent infectious exacerbations & the probability of further deteriorating lung function over time; Must quit all smoking & take meds as outlined regularly;  We discussed my retirement at the end of 2019 & transition to [facility] Of [facility] in [place]...    ~  May 13, 2018:  77moROV & pulmonary follow up visit>  CHarrie Jeansreports a good interval-feels that he is at baseline, denies much cough, small amt beige sput on occas, no hemoptysis; he denies f/c/s, doesn't note any wheezing, chest discomfort, or incr dyspnea;  He has chr stable DOE but no change & he has been working steadily- built a deck, tiled a bathroom, framing an addition, etc without breathing difficulty;  Unfortunately he has continued to smoke a pipe- consuming ~1 packet of tobacco weekly and has been unable to quit despite all treatment modalities tried...  He did have one infectious exac in the interval- called 03/04/18 w/ cough, incr yellow mucus production, and we called in DOXY x14d in addition to his reglar meds (see below) and he improved back to baseline...  Note: his last Spirometry 01/2015 showed FEV1=1.82 (46%) c/w GOLD stage3 COPD & we discussed repeating his Spirometry today to compare...  We reviewed the following medical problems during today's office visit>      COPD, Emphysematous blebs, s/p RUL bullectomy 1997 for infected bleb w/ large A/F level> on Advair250Bid, Spiriva daily, Mucinex '1200mg'$ Bid w/ fluids, Flutter valve rx;  hx normal A1AT level & MM phenotype;  min cough & sput, no hemoptysis, stable DOE w/o change, no edema, etc; he reports cut back on pipe smoking but must quit completely as above; CXR was stable x yrs w/ chr changes on right & bullous dis on left; then he developed LUL pneumonia [place], & f/u CXR here 09/25/16 showed new LUL abnormality => CT Chest, subseq PET scan, and  serial CT scans as documented above...    Hx LUL pneumonia [id number]/ residual LUL parenchymal abnormality, small air/fluid level & several small scat bilat pulm nodules on CT Chest > clinically pt is feeling & breathing well, at baseline, w/o much cough/ sputum/ etc; we considered bronch & lavage- but pt preferred following serial scans for now (last CT 10/2017 above)...    PIPE SMOKER>  Despite his best efforts- he has been unable to quit the pipe smoking...    Coronary & aortic atherosclerosis on CT Chest>  EKGs show NSR, IVCD, ?early repol, no acute changes...     Chol> on diet alone & his weight is good; FLP 2/19 showed TChol 148, TG 86, HDL 37, LDL 94 => stable & doing satis on diet...    GERD> on Nexium40; Protonix wasn't helpful; he denies abd pain, dysphagia, n/v, c/d, blood seen; he tried off the Nexium- symptoms returned therefore back on the PPI daily...    Colon Polyp & +FamHx colon ca (bro died at 52)> colon 1February 07, 2024w/ divertics &  tiny polyp, f/u done in [facility] in 2015, he reports neg & will get copies sent to [name]; PET scan 09/2016 for lung abn showed focal metabolic activ along the cecum (SUV 9.7) & he had f/u w/ DrSingh in [place] w/ repeat colon showing 1 sm polyp, otherw neg...    Prostate cancer> s/p robotic prostatectomy 5/10 & Fred [name] in [place] released him 9/12 & asked [name] to monitor PSA yearly; Labs 2/19 showed PSA= 0.00, requests Cialis for daily use. EXAM shows Afeb, VSS, O2sat=97% on RA;  HEENT- neg, mallampati1;  Chest- sl decr BS bilat w/o w/r/r/ or consolidation;  Heart- RR gr1/6 SEM w/o r/g;  Abd- soft, nontender, neg;  Ext- neg w/o c/c/e;  Neuro- intact w/o focal abn...  CXR 05/13/18 (independently reviewed by me in the PACS system) showed  norm heart size, norm pulmonary vascularity; hyperinflated with emphysematous changes, architectural distortion, and bullae in the left greater than right upper lobes. No focal consolidation, pleural effusion, or pneumothorax-- decr A/F  level in left apical cavity...    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- no CP, SOB, etc;  He still smokes his pipe and knows that he needs to quit completely but has been unable to even cut back in a sustained fashion (still smokes ~1packet of tobacco per week);  He has been regular w/ his Advair250-Bid and Spriva handihaler daily; also using the [facility] '1200mg'$  bid w/ fluids and using flutter valve treatments regularly (prod of a small amt of thick beige sputum);  CXR shows perhaps some sl improvement in the LUL apical A/F level, and we wanted to repeat his spirometry breathing test today but were unable to do so;  He is going to select a pulmonary physician in [place] as I will be retiring at the end of 2019, and I will send records...            Problem List:  ALLERGY (ICD-995.3) - uses OTC antihistamines Prn...  COPD (ICD-496) & EMPHYSEMATOUS BLEB (ICD-492.0) - on [place] daily... unfortunately he still smokes a pipe daily (w/ min cough, drainage, etc)... he has a neg FamHx of lung disease and a normal A1AT level (MM phenotype)... he is s/p VATS- RUL bullectomy in 1997 for infected bulla w/ A/F level. ~  baseline CXR's back to 1990 showed large bilat bullae R>L & scarring at left base... ~  f/u CXR's w/ vol loss on right from the surg, bullae on the left, scarring, etc... ~  prev CT Chest revealed mult blebs & bullae in RUL & LUL to the lingular w/ scarring...  ~  1997= 14cm RUL bulla w/ AF level (required VATS Bullectomy- path= emphysema, infected bulla w/ acute/ chr inflamm). ~  CT Chest 10/09 in [place]= similar bullous dis on left w/ emphysema, post-op changes on right, NAD,etc. ~  PFT 6/00 showed FVC= 4.41 (84%), FEV1= 2.53 (59%), %1sec= 57, mid-flows= 25%pred... ~  PFT 6/06 showed FVC= 4.59 (87%), FEV1= 2.79 (65%), %1sec= 61, mid-flows= 27%pred... ~  CXR 5/11 showed advanced chr  lung dis w/ hyperinflation, scarring, decr vol on right> NAD. ~  PFT 9/12 showed FVC= 4.07 (79%), FEV1= 2.34 (57%), %1sec= 57, mid-flows= 24%pred... c/w GOLD Stage2 COPD- MUST QUIT ALL SMOKING! ~  CXR 9/12 showed no signif change in his bullous lung dis & evid of prev surg on the right... Asked again to quit the pipe. 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 Must quit all smoking! 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 ~  Spirometry 02/17/15 showed FVC=3.25 (64%), FEV1=1.82 (46%), %1sec=56, mid-flows reduced at 21% predicted; c/w mod airflow obstruction & GOLD Stage3 COPD (can't r/o superimposed restriction w/o LVs)... ~  CXR 09/25/16>  Norm heart size, new pleuroparenchymal thickening & nodular density in left apex measuring about 2cm, increased interstitial markings bilat, emphysema & bullous changes bilat => proceed w/ CT Chest.  ABN CXR w/ LUL peripheral lesion found on routine CXR 09/25/16=> eval in progress... ~  He had LUL pneumonia treated by PCP in [facility] during the Fall2017 but this was not followed up by them...   Hx of CHEST PAIN, ATYPICAL (ICD-786.59) - on ASA '81mg'$ /d... hx intermittent chest wall tightness/ spasm/ ?palpit/ numb in left arm- last 2-3 min and resolves spont, not exercise induced... s/p cardiac eval 2/08 by DrCooper- he wanted to do treadmill and holter but pt never did these and states he is doing fine w/o any incr in symptoms... ~  EKG 5/11 showed NSR 74/min, IVCD, NAD (no ch from old tracings) ~  EKG 9/12 showed NSR 68/min, IVCD, NAD & no change...  HYPERCHOLESTEROLEMIA, MILD (ICD-272.0) - on diet alone... ~  [place] 12/07 showed TChol 182, TG 80, HDL 36, LDL 130... he prefers diet Rx. ~  [place] 12/09 showed  TChol 172, TG 81, HDL 36, LDL 120... rec- improve diet efforts, or consider statin. ~  FLP 5/11 showed TChol 169, TG 94, HDL 37, LDL 113... continue diet efforts. ~  FLP 9/12 showed TChol 172, TG 52, HDL 43, LDL 118 ~  FLP 10/13 on diet alone showed TChol 198, TG 99, HDL 47, LDL 131... Offered Crestor'5mg'$ /d, he will decide. ~  FLP 4/15 on diet alone showed TChol 174, TG 55, HDL 43, LDL 120... He prefers diet/ exercise & declined low dose statin rx. ~  FLP 7/16 on diet alone showed TChol 154, TG 79, HDL 41, LDL 97 ~  FLP 2/18 on diet alone showed TChol 164, TG 71, HDL 42, LDL 108  GERD (ICD-530.81) - on NEXIUM '40mg'$ /d... he states he tried off PPI meds but had to restart due to signif heartburn... ~  9/12:  He reports that insurance co required switch off Nexium onto Protonix but the latter isn't working w/ return of severe reflux symptoms & he wants to restart the [place] '40mg'$ /d... ~  10/13:  Doing satis back on the Nexium, but trouble w/ coverage thru his insurance... ~  4/15:  He continues stable on the Nexium40...  COLONIC POLYPS (ICD-211.3) & Family Hx of ADENOCARCINOMA, COLON (ICD-153.9)  - colonoscopy 7/01 by DrPatterson showed diminutive rectal polyp= hyperplastic... f/u planned 79yr but pt cancelled and never rescheduled the procedure... f/u colonoscopy done 1/10 showed divertics & tiny polyp w/o adenomatous change (+FamHx of colon cancer in brother who died of the disease at age 61... Repeat due 1/15. ~  4/15:  He knows that he is due for f/u colonoscopy & [facility] he tells me this was done, we do not have record.  ADENOCARCINOMA, PROSTATE (ICD-185) >> ~  5/11:  when seen 12/09 his routine PSA  was 5.98 & he was referred to [place] in [facility] where he lives for eval> +prostate cancer and s/p robotic prostatectomy 5/10 & doing well so far w/ PSA <0.01 on last check 11/10 w/ f/u due soon (Q64mo. ~  He had episode of micturition syncope that occurred  one night- awoke w/ full bladder & was standing to urinate, then fell w/ transient syncope, injured tooth & fx rib; recovered uneventfully & he is asked to sit down to empty bladder, stand slowly & be careful> no recurrence. ~  9/12:  He tells me that [place]" & [place]; PSA today = zero... ~  10/13:  Clinically stable w/o voiding difficulty, incont, etc; PSA= 0.00 ~  4/15:  He remains clinically stable w/o complaints and f/u PSA remains zero... ~  7/16:  PSA remains 0.00, he is asking about Cialis for daily use => given Rx.  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  ANXIETY (ICD-300.00)  Health Maintenance >> ~  GI:  His brother died age 6328w/ colon cancer; pt had hyperplastic polyp in past; last colonoscopy was 1/10 by DrPatterson- neg, f/u planned 550yr ~  GU:  Hx prostate cancer D[place] 2010 [place]/ [name] & doing well since then; PSA= zero ~  Immuniz:  He had PNEUMOVAX in 1999, and gets yearly flu shots... Pneumovax f/u vaccination given 12/09... Given TDAP 5/11   Past Surgical History:  Procedure Laterality Date  . CHOLECYSTECTOMY  2004  . OTHER SURGICAL HISTORY     S/P VATS w/ infected bleb removed from RUL 1997  . PROSTATECTOMY  11/2008   [facility]  Outpatient Encounter Medications as of 05/13/2018  Medication Sig  . aspirin 325 MG tablet Take 325 mg by mouth daily.    . cetirizine (ZYRTEC) 10 MG tablet Take 10 mg by mouth at bedtime.  . Marland Kitchensomeprazole (NEXIUM) 40 MG capsule TAKE 1 CAPSULE BY MOUTH  DAILY AT 12 NOON.  . Marland Kitchenluticasone-Salmeterol (ADVAIR DISKUS) 250-50 MCG/DOSE AEPB USE 1 INHALATION ORALLY  TWICE DAILY  . HYDROcodone-homatropine (HYCODAN) 5-1.5 MG/5ML syrup Take 5 mLs by mouth every 6 (six) hours as needed for cough. Every 4-6 hours as needed  . Multiple Vitamin (MULTIVITAMIN) tablet Take 1 tablet by mouth daily.    . Marland KitchenRespiratory Therapy Supplies (FLUTTER) DEVI As directed  . tadalafil (CIALIS) 5 MG tablet TAKE 1 TABLET BY MOUTH  DAILY  . tiotropium (SPIRIVA HANDIHALER) 18 MCG inhalation capsule INHALE THE CONTENTS OF 1  CAPSULE VIA HANDIHALER  DAILY  . [DISCONTINUED] azelastine (ASTELIN) 0.1 % nasal spray Place 2 sprays into both nostrils 2 (two) times daily as needed for rhinitis. Use in each nostril as directed  . [DISCONTINUED] buPROPion (WELLBUTRIN SR) 150 MG 12 hr tablet 1 tab daily X1 week, then maintenance 1 tab twice daily.  . [DISCONTINUED] doxycycline (VIBRA-TABS) 100 MG tablet Take 1 tablet (100 mg total) by mouth 2 (two) times daily.  . [DISCONTINUED] predniSONE (DELTASONE) 10 MG tablet Alternate 1 tab one day and 1/2 tab the next   No facility-administered encounter medications on file as of 05/13/2018.     No Known Allergies    Immunization History  Administered Date(s) Administered  . H1N1 07/01/2008  . Influenza Split 04/26/2011, 05/01/2012  . Influenza,inj,Quad PF,6+ Mos 08/17/2015, 08/21/2016, 06/09/2017, 05/13/2018  . Pneumococcal Conjugate-13 02/17/2015  .  Pneumococcal Polysaccharide-23 07/01/2008  . Td 12/22/2009  . Tdap 04/26/2011    Current Medications, Allergies, Past Medical History, Past Surgical History, Family History, and Social History were reviewed in [employer] record.     Review of Systems       CC is nasal congestion and drainage, +cough w/ beige sput (no hemoptysis), and stable DOE  The patient denies fever, chills, sweats, anorexia, fatigue, weakness, malaise, weight loss, sleep disorder, blurring, diplopia, eye irritation, eye discharge, vision loss, eye pain, photophobia, earache, ear discharge, tinnitus, decreased hearing, nasal congestion, nosebleeds, sore throat, hoarseness, chest pain, palpitations, syncope, orthopnea, PND, peripheral edema, dyspnea at rest, excessive sputum, hemoptysis, wheezing, pleurisy, nausea, vomiting, diarrhea,  constipation, change in bowel habits, abdominal pain, melena, hematochezia, jaundice, gas/bloating, indigestion/heartburn, dysphagia, odynophagia, dysuria, hematuria, urinary frequency, urinary hesitancy, nocturia, incontinence, back pain, joint pain, joint swelling, muscle cramps, muscle weakness, stiffness, arthritis, sciatica, restless legs, leg pain at night, leg pain with exertion, rash, itching, dryness, suspicious lesions, paralysis, paresthesias, seizures, tremors, vertigo, transient blindness, frequent falls, frequent headaches, difficulty walking, depression, anxiety, memory loss, confusion, cold intolerance, heat intolerance, polydipsia, polyphagia, polyuria, unusual weight change, abnormal bruising, bleeding, enlarged lymph nodes, urticaria, allergic rash, hay fever, and recurrent infections.     Objective:   Physical Exam    WD, WN, 62 y/o WM in NAD... GENERAL:  Alert & oriented; pleasant & cooperative... HEENT:  [place]/AT, EOM-wnl, PERRLA, Fundi-benign, EACs-clear, TMs-wnl, NOSE-clear, THROAT-clear & wnl. NECK:  Supple w/ fairROM; no JVD; normal carotid impulses w/o bruits; no thyromegaly or nodules palpated; no lymphadenopathy. CHEST:  decr BS bilat, without wheezes, rales, or rhonchi heard, no consolidation... HEART:  Regular Rhythm; without murmurs/ rubs/ or gallops. ABDOMEN:  Soft & nontender; normal bowel sounds; no organomegaly or masses detected. EXT: without deformities or arthritic changes; no varicose veins/ venous insuffic/ or edema. NEURO:  CN's intact; motor testing normal; sensory testing normal; gait normal & balance OK. DERM:  No lesions noted; no rash etc...  RADIOLOGY DATA:  Reviewed in the EPIC EMR & discussed w/ the patient...  LABORATORY DATA:  Reviewed in the EPIC EMR & discussed w/ the patient...   Assessment & Plan:    COPD/ Bullous Emphysema>  He is s/p RUL bullectomy1997 for an infected bulla w/ A/F level; A1AT prev measured and wnl/ MM phenotype; PFT w/  mod airflow obstruction; must quit all smoking... Continue [place], Mucinex as needed, etc... NEW CXR ABNORMALITY 2/18 w/ LUL peripheral abnormality that occurred in the wake of a LUL pneumonia from Oct2017- treated w/ Levaquin in [place];  He has had CT, LABS, PET => see above & decision made to continue close follow up w/ serial CXR/ scans...  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 I presented case to DrNestor who agreed w/ the conservative approach at this point in light of no clear cut signs of worsening parenchymal dis...  06/09/17>   Tarl remains asymtomatic w/o much cough/ sputum/ SOB/ chest discomfort/ etc;  He is not limited in his work/ daily life/ ADLs  in any way and he is not inclined to pursue a more aggressive posture for additional evaluation of his pulm process=> we will continue conservative management- he will work on all smoking (pipe) cessation, continue the Advair/ Spiriva, continue exercise, etc... We plan rov in 3-90mow/ CXR & fasting blood work. 09/10/17>   We decided to treat CJuanda Crumble symptoms w/ ZPak/ Pred dosepak/ Hycodan and he will try to decr & quit the pipe smoking!!! LUL changes continue to evolve & I am concerned that he has an air-fluid level that could get infected- (but the norm CBC & Sed rate are reassuring); we will continue to monitor carefully & plan f/u CT Chest [id number]..  11/06/17>   CT w/ ?worsening or ?superimposed LUL parenchymal process- no sput to culture, Afeb, Labs are ok w/ norm CBC/Chem panel but elev sed at 44;  we decided to treat w/ AUGMENTIN 875Bid x 10d (cover w/ Align & Activia), plus a course of oral PRED- Depo120 today, then Pred'20mg'$  Bid x1wk, Qd x1wk, then '10mg'$  daily til ret in 4wks;  Rec Mucinex, water, Hycodan prn; call for any worsening in breathing;  NO SMOKING and take it easy until recheck appt 12/17/17>   CHarrie Jeansis clinically improved and back to baseline, unfortunately still smoking his pipe but he requests Rx for CHANTIX & says he'll try it!  CXR shows similar chronic thick-walled LUL bullous lesion & A/F level and parenchymal scarring but no evid of pneumonia or air-space dis;  Pred has weaned down to '10mg'$  Qam & we discussed slow further taper to '10mg'$  alt w/ '5mg'$  Qod for the next month, then asked him to call me w/ update, & would anticipate weaning further to '5mg'$ /d after that w/ ROV in about 246mo/17/19>    I'm worried about the recurrent infectious exacerbations & the probability of further deteriorating lung function over time; Must quit all smoking & take meds as outlined regularly;  We discussed my retirement at the end of 2019 & [place] [facility] [place] [place]   ChHarrie Jeanss clinically stable and 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- no CP, SOB, etc;  He still smokes his pipe and knows that he needs to quit completely but has been unable to even cut back in a sustained fashion (still smokes ~1packet of tobacco per week);  He has been regular w/ his Advair250-Bid and Spriva handihaler daily; also using the [place]'1200mg'Y$  bid w/ fluids and using flutter valve treatments regularly (prod of a small amt of thick beige sputum);  CXR shows perhaps some sl improvement in the LUL apical A/F level, and we wanted to repeat his spirometry breathing test today but were unable to do so;  [place] end of [place], and I will send records   PIPE SMOKER>  ChLeanordnderstands that he  must quit all smoking but doesn't seem able to quit the pipe!  Hx AtypCP>  No further chest discomfort; EKG w/ NSR, IVCD, NAD...  Borderline CHOL>  LDL not at goal but he is not inclined to take statin Rx; therefore diet alone...  GERD>  He has signif symptoms and NOT improved w/ Protonix; NEXIUM is the only PPI that works for him & we will re-write this med...  Colon Polyps>  As noted brother died age 61/ colon cancer; last colonoscopy 1/10 was ok & f/u due now- he  will get [facility] in [facility]...  PROSTATE CANCER> Fred [name] in [place] has released him & asked that we do yearly PSA; todays lab w/ PSA= zero... NOTE: hx one episode of micturition syncope w/o recurrence...  DJD>  Stable, uses OTC analgesics as needed...  Anxiety>  Aware, stable, no meds required...   Patient's Medications  New Prescriptions   No medications on file  Previous Medications   ASPIRIN 325 MG TABLET    Take 325 mg by mouth daily.     CETIRIZINE (ZYRTEC) 10 MG TABLET    Take 10 mg by mouth at bedtime.   ESOMEPRAZOLE (NEXIUM) 40 MG CAPSULE    TAKE 1 CAPSULE BY MOUTH  DAILY AT 12 NOON.   FLUTICASONE-SALMETEROL (ADVAIR DISKUS) 250-50 MCG/DOSE AEPB    USE 1 INHALATION ORALLY  TWICE DAILY   HYDROCODONE-HOMATROPINE (HYCODAN) 5-1.5 MG/5ML SYRUP    Take 5 mLs by mouth every 6 (six) hours as needed for cough. Every 4-6 hours as needed   MULTIPLE VITAMIN (MULTIVITAMIN) TABLET    Take 1 tablet by mouth daily.     RESPIRATORY THERAPY SUPPLIES (FLUTTER) DEVI    As directed   TADALAFIL (CIALIS) 5 MG TABLET    TAKE 1 TABLET BY MOUTH  DAILY   TIOTROPIUM (SPIRIVA HANDIHALER) 18 MCG INHALATION CAPSULE    INHALE THE CONTENTS OF 1  CAPSULE VIA HANDIHALER  DAILY  Modified Medications   No medications on file  Discontinued Medications   AZELASTINE (ASTELIN) 0.1 % NASAL SPRAY    Place 2 sprays into both nostrils 2 (two) times daily as needed for rhinitis. Use in each nostril as directed   BUPROPION (WELLBUTRIN SR) 150 MG 12 HR TABLET     1 tab daily X1 week, then maintenance 1 tab twice daily.   DOXYCYCLINE (VIBRA-TABS) 100 MG TABLET    Take 1 tablet (100 mg total) by mouth 2 (two) times daily.   PREDNISONE (DELTASONE) 10 MG TABLET    Alternate 1 tab one day and 1/2 tab the next

## 2018-05-13 NOTE — Patient Instructions (Signed)
Today we updated your med list in our EPIC system...    Continue your current medications the same...  Today we did your follow up CXR...    We will contact you w/ the results when available...   We've discussed pulmonary follow up starting 2020 in [place]...  Fred [name] and Fred [name],  It has been my great honor to have been one of your doctors over these many years!    Wishing you & your family good health & much happiness in the years to come!!!

## 2018-05-17 ENCOUNTER — Other Ambulatory Visit: Payer: Self-pay | Admitting: Pulmonary Disease

## 2018-07-17 ENCOUNTER — Encounter: Payer: Self-pay | Admitting: Pulmonary Disease

## 2018-09-18 ENCOUNTER — Other Ambulatory Visit: Payer: Self-pay | Admitting: Pulmonary Disease

## 2018-09-18 NOTE — Telephone Encounter (Signed)
Called and spoke with patient, as Dr. Jeannine Kitten last office visit states for patient to follow up in [place] for pulmonary services. Patient could not get appt until [facility] May. I told him that I would send one refill into [facility] for 90 days and then he would need to move his medication to his new provider. Patient appreciated the help and will change at his appointment in May.  Nothing further needed at this time.

## 2018-09-28 ENCOUNTER — Other Ambulatory Visit: Payer: Self-pay | Admitting: Pulmonary Disease

## 2018-12-04 ENCOUNTER — Other Ambulatory Visit: Payer: Self-pay | Admitting: Pulmonary Disease

## 2018-12-08 ENCOUNTER — Other Ambulatory Visit: Payer: Self-pay | Admitting: Pulmonary Disease

## 2018-12-26 ENCOUNTER — Other Ambulatory Visit: Payer: Self-pay | Admitting: Pulmonary Disease

## 2018-12-28 MED ORDER — TIOTROPIUM BROMIDE MONOHYDRATE 18 MCG IN CAPS: ORAL_CAPSULE | RESPIRATORY_TRACT | 0 refills | Status: AC

## 2018-12-28 MED ORDER — ESOMEPRAZOLE MAGNESIUM 40 MG PO CPDR: DELAYED_RELEASE_CAPSULE | ORAL | 0 refills | Status: AC

## 2018-12-28 MED ORDER — TADALAFIL 5 MG PO TABS: 5.0000 mg | ORAL_TABLET | Freq: Every day | ORAL | 0 refills | Status: AC

## 2018-12-28 NOTE — Telephone Encounter (Signed)
Former Nadel Pt reached out to [employer] via [employer] inquiring on getting refills on Spiriva, Nexium, and Tadalafil 5mg . Pt states his appt to establish new PCP has been pushed out until the first of July and that he is about to run out of these of his medications. As of today, I do not see any future appts on file for pt.   Pt last seen 05/13/2018 w/ SN. I checked his current medication list and observed Nexium started on 12/09/2018 w/ 90-day supply available. Spiriva started 12/04/2018 w/ 90-day supply as well.  Tadalafil 5 mg is the only medication that would, according to our records, have run out, as it was started 05/18/2018 90 tablet w/ 1 refill available. Pt uses [facility] and requires 90-day supplies.   Since SN is no longer in office, will route to provider of the day.  SG, please advise on your recommendations for this pt. Thank you.

## 2018-12-28 NOTE — Telephone Encounter (Signed)
Per internal note from SG:  Please refill these medications for 3 months. Thanks  Called & spoke w/ pt to let him know we sent in orders for Spiriva, Nexium, and Tadalafil 5 mg for 3 months--90 day supply to [facility]. Pt verbalized understanding with no additional questions. A MyChart message has also been sent with this same information. Nothing further needed at this time.

## 2019-02-16 ENCOUNTER — Other Ambulatory Visit: Payer: Self-pay | Admitting: Acute Care

## 2020-02-08 IMAGING — DX DG CHEST 2V
2 series · 2 of 2 positions shown · non-contrast
Comparison: Chest x-ray of 09/10/2017

CLINICAL DATA: Cough, congestion, body aches

EXAM:
CHEST - 2 VIEW

[chest pa]
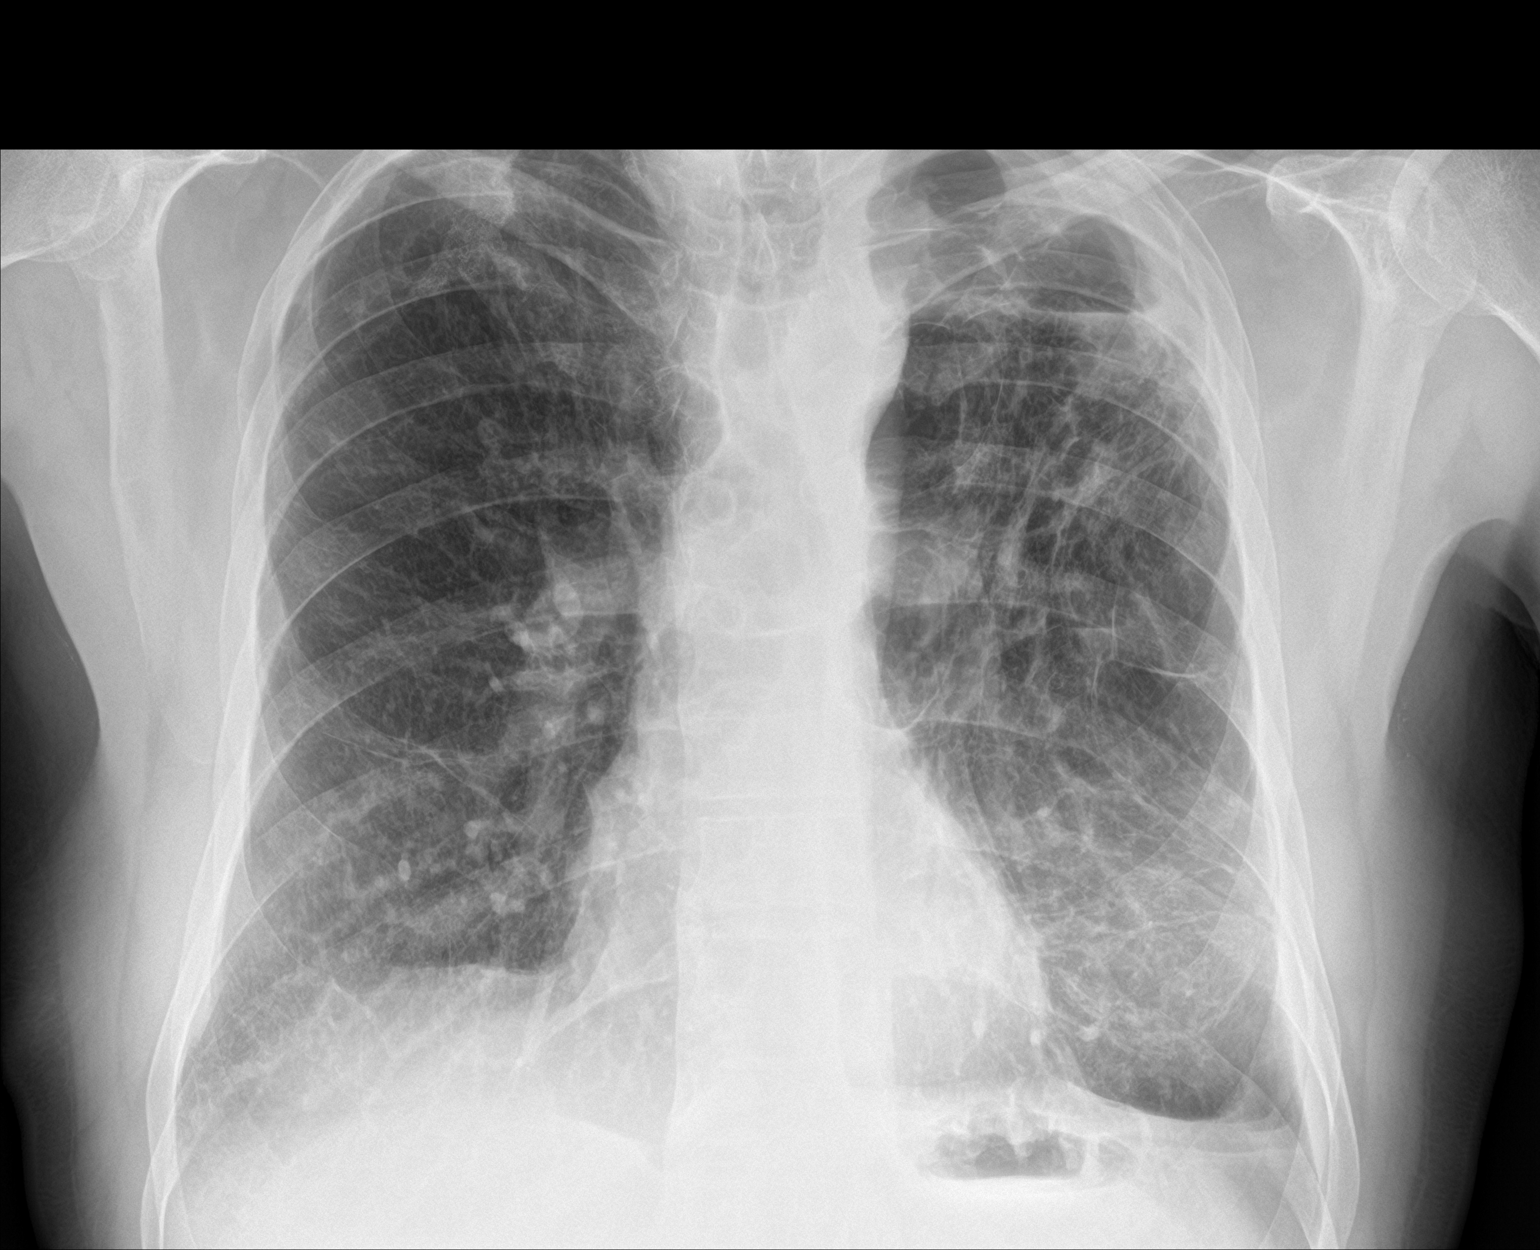

[chest lat]
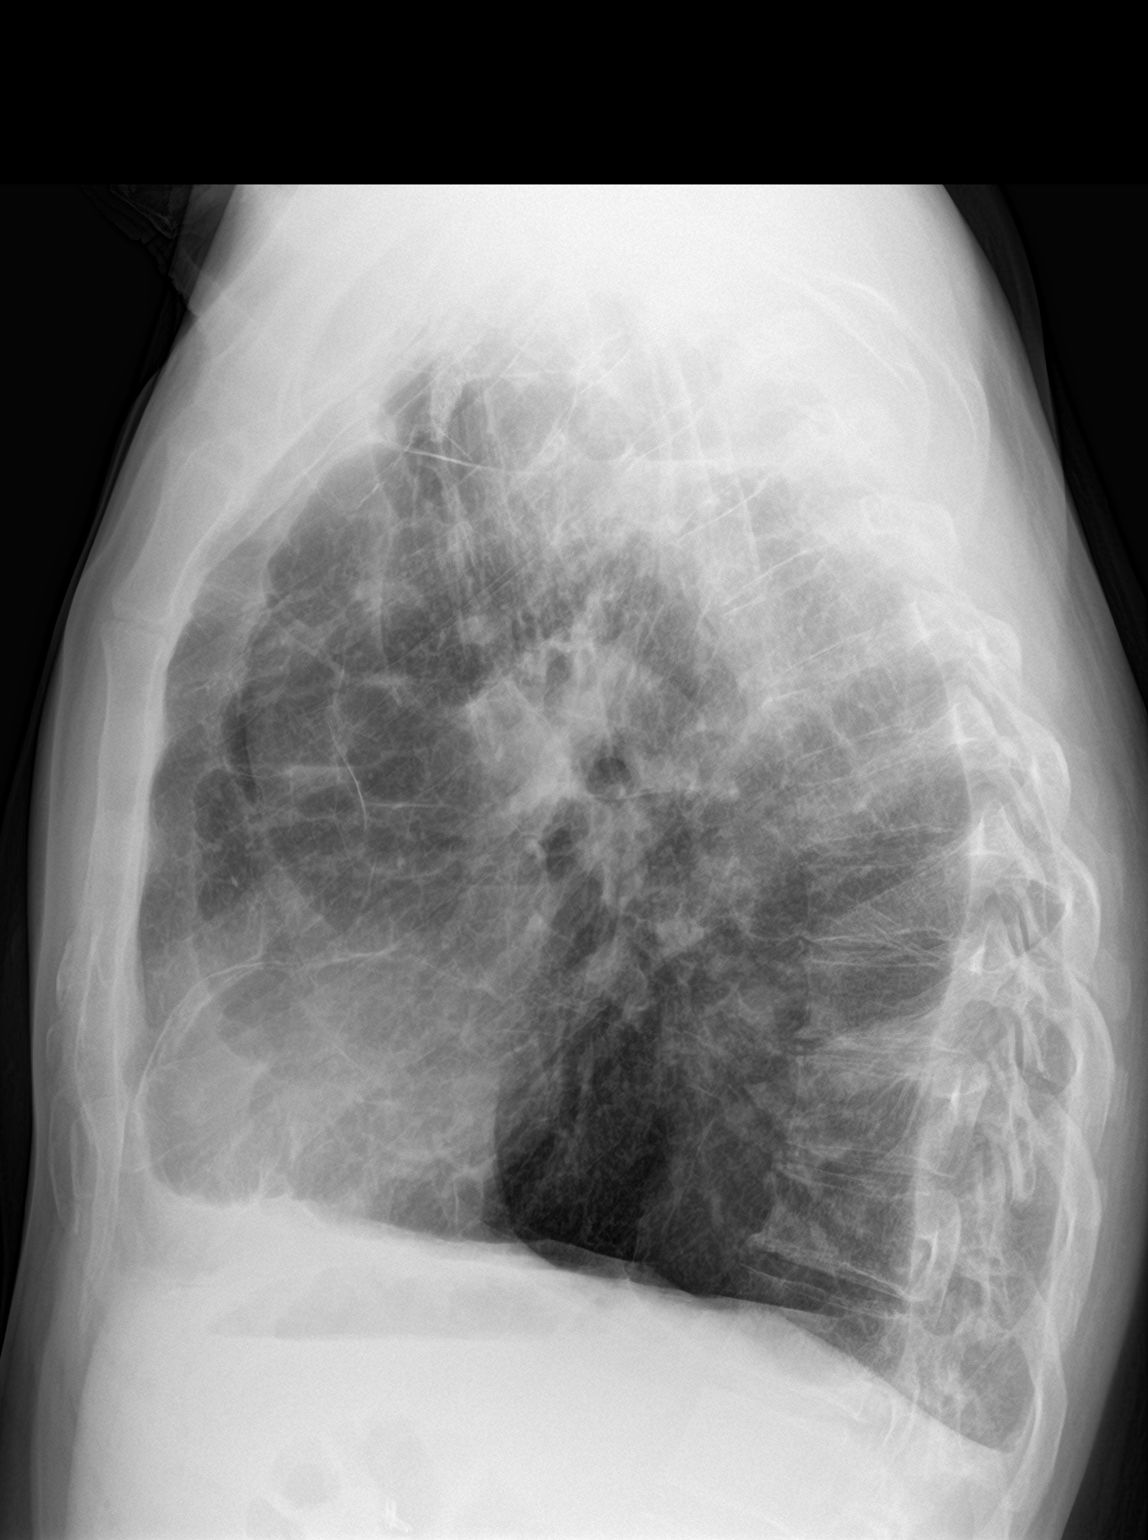

[2 of 2 positions shown; findings below may reference images not displayed]

FINDINGS: There has been an increase in fluid within a bulla within the left
lung apex. This could representan infected bulla. Chronic changes
are also again noted within the lung apices with some
pleural-parenchymal scarring to the apex. The remainder of the lungs
remain somewhat hyperaerated. On the lateral view there are nodular
opacities seen anteriorly which are not seen previously and which
may be within the left upper lobe. CT of the chest may be helpful to
evaluate further. Mediastinal and hilar contours are unchanged and
heart size is stable. No acute bony abnormality seen with
degenerative change present in the lower thoracic spine.
IMPRESSION: 1. Air-fluid level within a bulla within the left upper lobe may
indicate infected bulla. Stable chronic changes in the apices as
well.
2. Nodular opacities anteriorly possibly within the left upper lobe
not definitely seen previously. CT of the chest may be helpful to
evaluate further.
3. Hyperaeration consistent with emphysema.

## 2020-03-20 IMAGING — DX DG CHEST 2V
2 series · 2 of 2 positions shown · non-contrast
Comparison: 11/06/2017

CLINICAL DATA: Bullous lung disease.  Emphysema.

EXAM:
CHEST - 2 VIEW

[chest pa]
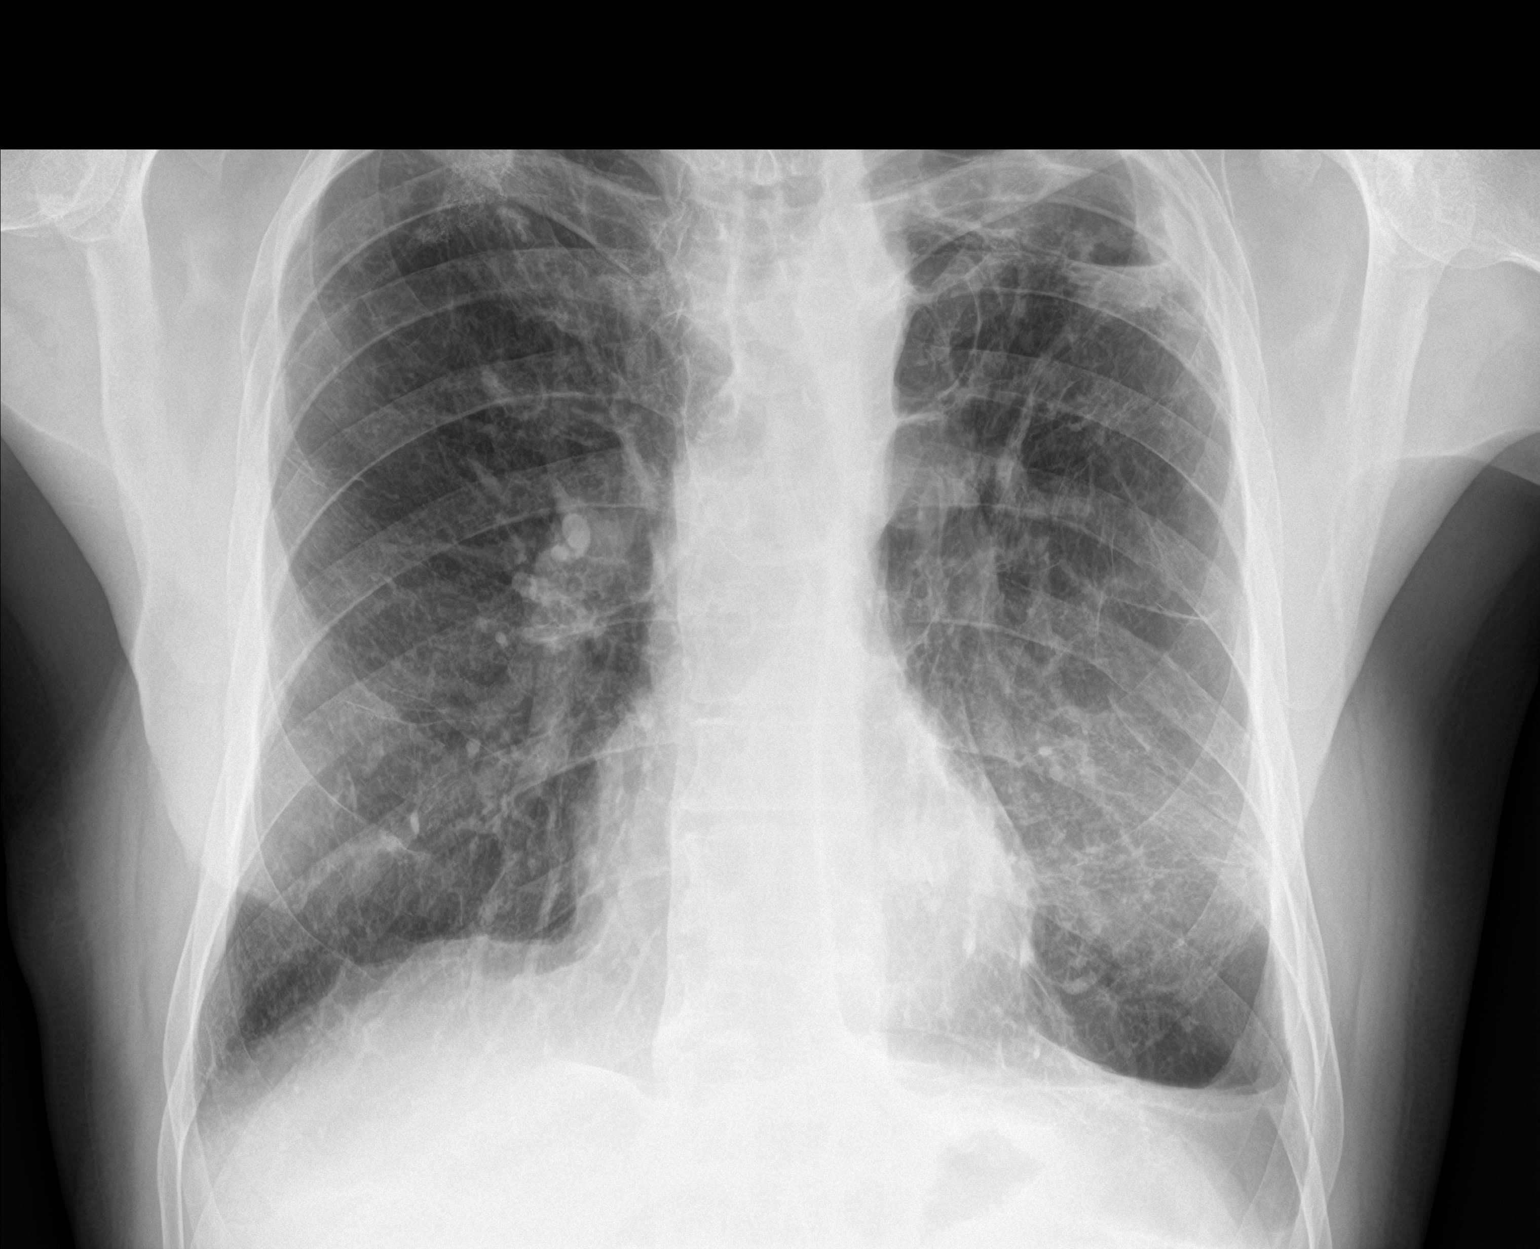

[chest lat]
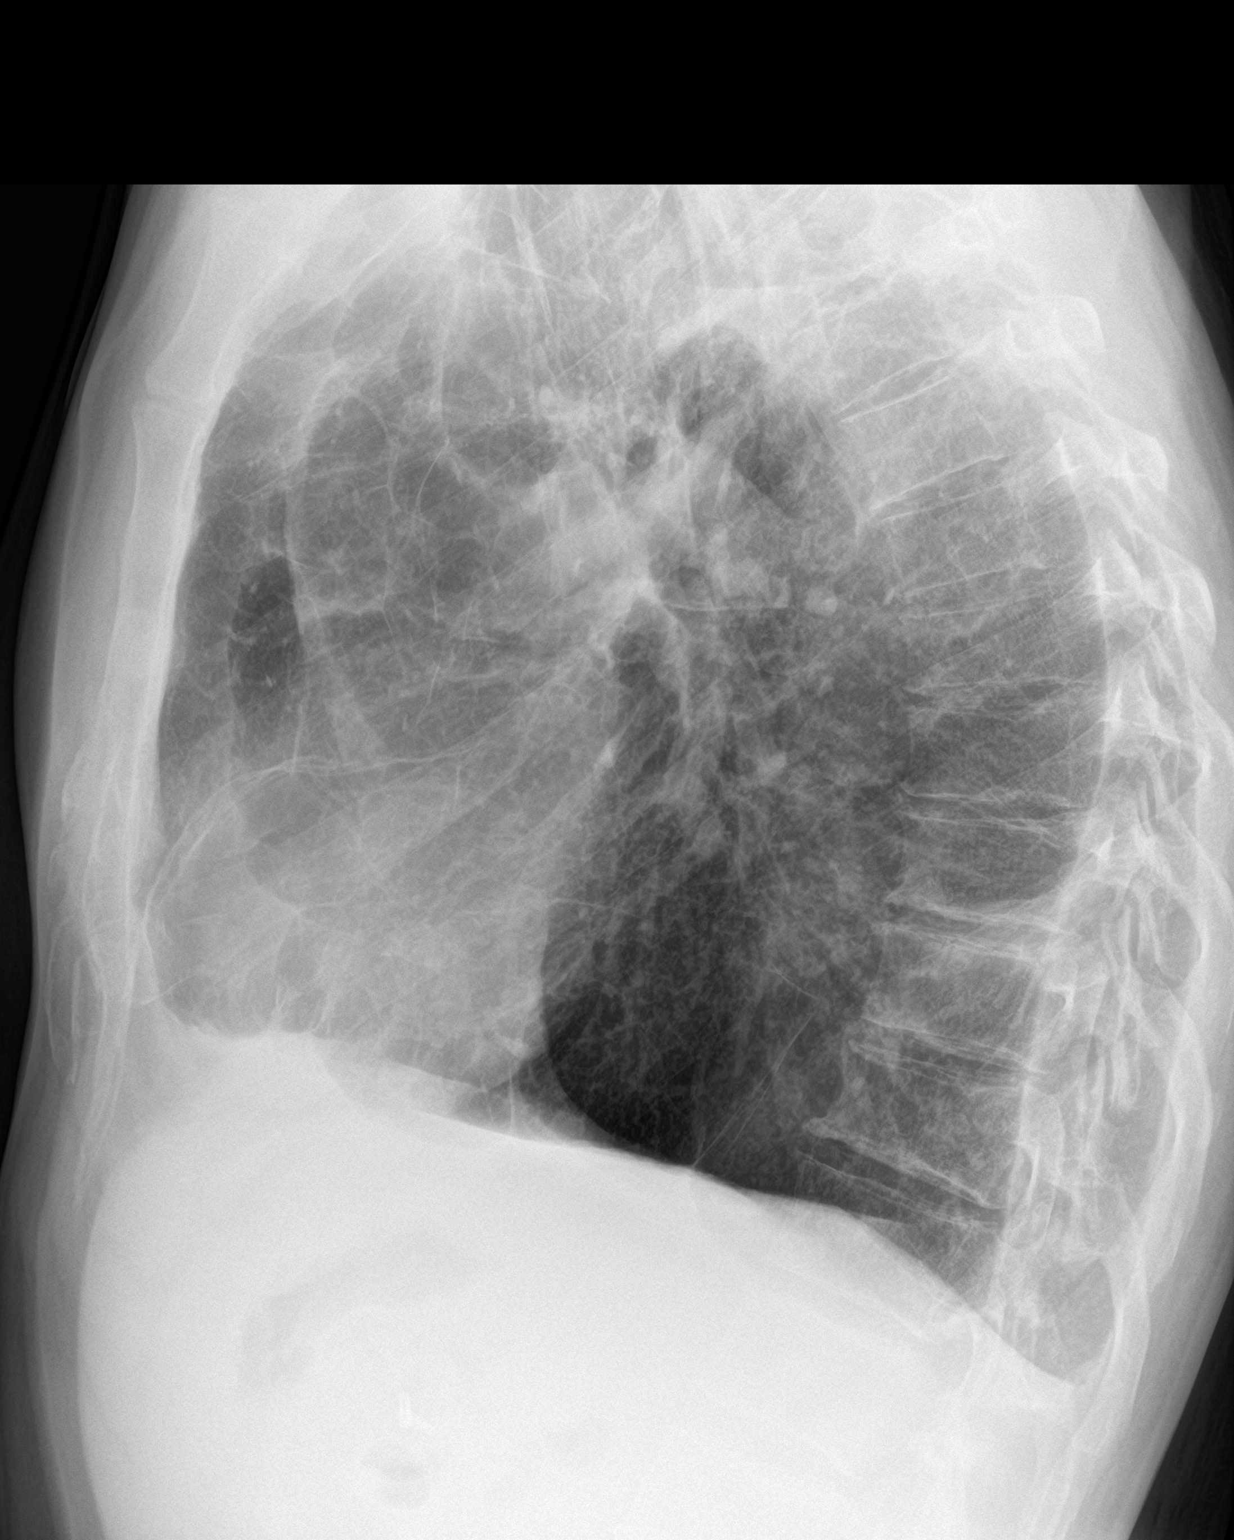

[2 of 2 positions shown; findings below may reference images not displayed]

FINDINGS: Lungs are hyperexpanded. Architectural distortion and bullous change
again noted in the upper lungs, left greater than right. Air-fluid
level in the bullous left apical lesion is similar to prior. No
pleural effusion. The cardiopericardial silhouette is within normal
limits for size. The visualized bony structures of the thorax are
intact.
IMPRESSION: 1. Stable air-fluid level in the cystic lesion identified at the
left apex.
2. Emphysema with bilateral architectural distortion/scarring,
similar to prior.
